# Patient Record
Sex: Female | Born: 1965 | Race: Black or African American | Hispanic: No | Marital: Married | Smoking: Never smoker
Health system: Southern US, Community
[De-identification: ages and names within clinical notes are randomized; demographics above are authoritative.]

## PROBLEM LIST (undated history)

## (undated) DIAGNOSIS — I10 Essential (primary) hypertension: Secondary | ICD-10-CM

## (undated) HISTORY — PX: CARPAL TUNNEL RELEASE: SHX101

## (undated) HISTORY — PX: BREAST ENHANCEMENT SURGERY: SHX7

---

## 1998-11-19 ENCOUNTER — Other Ambulatory Visit: Admission: RE | Admit: 1998-11-19 | Discharge: 1998-11-19 | Payer: Self-pay | Admitting: Obstetrics

## 1998-11-22 ENCOUNTER — Other Ambulatory Visit: Admission: RE | Admit: 1998-11-22 | Discharge: 1998-11-22 | Payer: Self-pay | Admitting: Obstetrics

## 2000-07-31 ENCOUNTER — Other Ambulatory Visit: Admission: RE | Admit: 2000-07-31 | Discharge: 2000-07-31 | Payer: Self-pay | Admitting: Obstetrics and Gynecology

## 2002-07-21 ENCOUNTER — Encounter: Payer: Self-pay | Admitting: Obstetrics and Gynecology

## 2002-07-21 ENCOUNTER — Encounter: Admission: RE | Admit: 2002-07-21 | Discharge: 2002-07-21 | Payer: Self-pay | Admitting: Obstetrics and Gynecology

## 2003-07-21 ENCOUNTER — Other Ambulatory Visit: Admission: RE | Admit: 2003-07-21 | Discharge: 2003-07-21 | Payer: Self-pay | Admitting: Obstetrics and Gynecology

## 2004-08-23 ENCOUNTER — Other Ambulatory Visit: Admission: RE | Admit: 2004-08-23 | Discharge: 2004-08-23 | Payer: Self-pay | Admitting: Obstetrics and Gynecology

## 2005-09-26 ENCOUNTER — Other Ambulatory Visit: Admission: RE | Admit: 2005-09-26 | Discharge: 2005-09-26 | Payer: Self-pay | Admitting: Obstetrics and Gynecology

## 2012-02-28 ENCOUNTER — Emergency Department
Admission: EM | Admit: 2012-02-28 | Discharge: 2012-02-28 | Disposition: A | Discharge status: Home Health | Payer: Self-pay | Source: Home / Self Care | Attending: Emergency Medicine | Admitting: Emergency Medicine

## 2012-02-28 ENCOUNTER — Encounter: Payer: Self-pay | Admitting: *Deleted

## 2012-02-28 DIAGNOSIS — M25562 Pain in left knee: Secondary | ICD-10-CM

## 2012-02-28 NOTE — ED Provider Notes (Signed)
History     CSN: [id number]  Arrival date & time 02/28/12  1332   First MD Initiated Contact with Patient 02/28/12 1359      Chief Complaint  Patient presents with  . Knee Pain    left    (Consider location/radiation/quality/duration/timing/severity/associated sxs/prior treatment) HPI  History reviewed. No pertinent past medical history.  Past Surgical History  Procedure Date  . Cesarean section   . Carpal tunnel release   . Breast enhancement surgery     Family History  Problem Relation Age of Onset  . Hypertension Mother     History  Substance Use Topics  . Smoking status: Never Smoker   . Smokeless tobacco: Not on file  . Alcohol Use: No    OB History    Grav Para Term Preterm Abortions TAB SAB Ect Mult Living                  Review of Systems  Allergies  Motrin  Home Medications   Current Outpatient Rx  Name Route Sig Dispense Refill  . NORGESTIMATE-ETH ESTRADIOL 0.25-35 MG-MCG PO TABS Oral Take 1 tablet by mouth daily.      BP 155/101  Pulse 72  Resp 14  Ht 5\' 3"  (1.6 m)  Wt 189 lb (85.73 kg)  BMI 33.48 kg/m2  SpO2 99%  Physical Exam  ED Course  Procedures (including critical care time)  Labs Reviewed - No data to display No results found.   1. Left knee pain       MDM   This patient was seen and evaluated but would not be charged. The patient with no insurance and we're deferring treatment to sports medicine since it didn't show requires a more thorough examination and treatment protocol. Basically this patient has had left knee pain for the last 4 months and is now complaining of left knee pain and stiffness. 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 In addition she has been participating in [place] which has been aggravating her knee. She is trying to lose weight as well as back in shape, and she feels that getting her knee better would make this easier. Her physical examination demonstrates  slightly decreased range of motion in terms of flexion compared to the contralateral side, no effusion, with some posterior tenderness. No other tenderness is elicited. McMurray's test does cause some irritation. Distal neurovascular status is intact. I think her pain is likely multifactorial, possibly from some arthritis, possibly from a small meniscal pathology, or muscle stiffness and weight gain. I wonder send her to sports medicine who can do more workup, x-rays or ultrasound and get her into physical therapy or treat her as necessary. However because I'm not doing anything for her today in our clinic in terms of treatment, I'm going to return her payment.    Marlaine Hind, MD 02/28/12 [phone]

## 2012-02-28 NOTE — ED Notes (Signed)
Patient c/o left knee pain. She has used ice and aleve.

## 2014-03-02 ENCOUNTER — Encounter: Payer: Self-pay | Admitting: Emergency Medicine

## 2014-03-02 ENCOUNTER — Emergency Department (INDEPENDENT_AMBULATORY_CARE_PROVIDER_SITE_OTHER)
Admission: EM | Admit: 2014-03-02 | Discharge: 2014-03-02 | Disposition: A | Discharge status: Home / Self Care | Payer: Self-pay | Source: Home / Self Care | Attending: Family Medicine | Admitting: Family Medicine

## 2014-03-02 DIAGNOSIS — N3000 Acute cystitis without hematuria: Secondary | ICD-10-CM

## 2014-03-02 DIAGNOSIS — R300 Dysuria: Secondary | ICD-10-CM

## 2014-03-02 LAB — POCT URINALYSIS DIP (MANUAL ENTRY)
Bilirubin, UA: NEGATIVE
GLUCOSE UA: NEGATIVE
Nitrite, UA: NEGATIVE
Protein Ur, POC: 100
Spec Grav, UA: 1.025 (ref 1.005–1.03)
Urobilinogen, UA: 0.2 (ref 0–1)
pH, UA: 6 (ref 5–8)

## 2014-03-02 MED ORDER — NITROFURANTOIN MONOHYD MACRO 100 MG PO CAPS: 100.0000 mg | ORAL_CAPSULE | Freq: Two times a day (BID) | ORAL | Status: DC

## 2014-03-02 NOTE — ED Provider Notes (Signed)
CSN: [id number]     Arrival date & time 03/02/14  1126 History   First MD Initiated Contact with Patient 03/02/14 1140     Chief Complaint  Patient presents with  . Dysuria  . Abdominal Pain      HPI Comments: Patient complains of onset of lower abdominal pressure 2 days ago, and discomfort with urination.  No fevers, chills, and sweats.  She states that she is just finishing a normal menstrual period.  Patient is a 48 y.o. female presenting with dysuria. The history is provided by the patient.  Dysuria Pain quality:  Burning Pain severity:  Mild Onset quality:  Sudden Duration:  2 days Timing:  Constant Progression:  Unchanged Chronicity:  New Recent urinary tract infections: no   Relieved by:  Nothing Worsened by:  Nothing tried Ineffective treatments:  None tried Urinary symptoms: frequent urination   Urinary symptoms: no discolored urine, no foul-smelling urine, no hematuria, no hesitancy and no bladder incontinence   Associated symptoms: abdominal pain   Associated symptoms: no fever, no flank pain, no genital lesions, no nausea, no vaginal discharge and no vomiting   Risk factors: no recurrent urinary tract infections     History reviewed. No pertinent past medical history. Past Surgical History  Procedure Laterality Date  . Cesarean section    . Carpal tunnel release    . Breast enhancement surgery     Family History  Problem Relation Age of Onset  . Hypertension Mother    History  Substance Use Topics  . Smoking status: Never Smoker   . Smokeless tobacco: Not on file  . Alcohol Use: No   OB History   Grav Para Term Preterm Abortions TAB SAB Ect Mult Living                 Review of Systems  Constitutional: Negative for fever.  Gastrointestinal: Positive for abdominal pain. Negative for nausea and vomiting.  Genitourinary: Positive for dysuria. Negative for flank pain and vaginal discharge.  All other systems reviewed and are negative.    Allergies   Motrin  Home Medications   Current Outpatient Rx  Name  Route  Sig  Dispense  Refill  . nitrofurantoin, macrocrystal-monohydrate, (MACROBID) 100 MG capsule   Oral   Take 1 capsule (100 mg total) by mouth 2 (two) times daily. Take with food.   14 capsule   0   . norgestimate-ethinyl estradiol (ORTHO-CYCLEN,SPRINTEC,PREVIFEM) 0.25-35 MG-MCG tablet   Oral   Take 1 tablet by mouth daily.          BP 136/83  Pulse 96  Temp(Src) 97.9 F (36.6 C) (Oral)  Resp 16  Ht 5\' 3"  (1.6 m)  Wt 194 lb 8 oz (88.225 kg)  BMI 34.46 kg/m2  SpO2 99% Physical Exam Nursing notes and Vital Signs reviewed. Appearance:  Patient appears stated age, and in no acute distress.  Patient is obese (BMI 34.5) Eyes:  Pupils are equal, round, and reactive to light and accomodation.  Extraocular movement is intact.  Conjunctivae are not inflamed  Pharynx:  Normal Neck:  Supple.  No adenopathy Lungs:  Clear to auscultation.  Breath sounds are equal.  Heart:  Regular rate and rhythm without murmurs, rubs, or gallops.  Abdomen:  Mild tenderness midline over bladder without masses or hepatosplenomegaly.  Bowel sounds are present.  No CVA or flank tenderness.  Extremities:  No edema.  No calf tenderness Skin:  No rash present.   ED Course  Procedures  Labs Reviewed  URINE CULTURE  POCT URINALYSIS DIP (MANUAL ENTRY); KET 15mg /dL; BLO moderate; PRO 100mg /dL; LEU small        MDM   1. Dysuria   2. Acute cystitis    Urine culture pending.  Begin Macrobid. Increase fluid intake. Followup with Family Doctor if not improved in one week.     Kandra Nicolas, MD 03/02/14 [phone]

## 2014-03-02 NOTE — ED Notes (Signed)
C/o lower mid abdominal pain and pressure with urination since Saturday. Has not tried anything otc.

## 2014-03-02 NOTE — Discharge Instructions (Signed)
Increase fluid intake. ° ° °Urinary Tract Infection °Urinary tract infections (UTIs) can develop anywhere along your urinary tract. Your urinary tract is your body's drainage system for removing wastes and extra water. Your urinary tract includes two kidneys, two ureters, a bladder, and a urethra. Your kidneys are a pair of bean-shaped organs. Each kidney is about the size of your fist. They are located below your ribs, one on each side of your spine. °CAUSES °Infections are caused by microbes, which are microscopic organisms, including fungi, viruses, and bacteria. These organisms are so small that they can only be seen through a microscope. Bacteria are the microbes that most commonly cause UTIs. °SYMPTOMS  °Symptoms of UTIs may vary by age and gender of the patient and by the location of the infection. Symptoms in young women typically include a frequent and intense urge to urinate and a painful, burning feeling in the bladder or urethra during urination. Older women and men are more likely to be tired, shaky, and weak and have muscle aches and abdominal pain. A fever may mean the infection is in your kidneys. Other symptoms of a kidney infection include pain in your back or sides below the ribs, nausea, and vomiting. °DIAGNOSIS °To diagnose a UTI, your caregiver will ask you about your symptoms. Your caregiver also will ask to provide a urine sample. The urine sample will be tested for bacteria and white blood cells. White blood cells are made by your body to help fight infection. °TREATMENT  °Typically, UTIs can be treated with medication. Because most UTIs are caused by a bacterial infection, they usually can be treated with the use of antibiotics. The choice of antibiotic and length of treatment depend on your symptoms and the type of bacteria causing your infection. °HOME CARE INSTRUCTIONS °· If you were prescribed antibiotics, take them exactly as your caregiver instructs you. Finish the medication even if  you feel better after you have only taken some of the medication. °· Drink enough water and fluids to keep your urine clear or pale yellow. °· Avoid caffeine, tea, and carbonated beverages. They tend to irritate your bladder. °· Empty your bladder often. Avoid holding urine for long periods of time. °· Empty your bladder before and after sexual intercourse. °· After a bowel movement, women should cleanse from front to back. Use each tissue only once. °SEEK MEDICAL CARE IF:  °· You have back pain. °· You develop a fever. °· Your symptoms do not begin to resolve within 3 days. °SEEK IMMEDIATE MEDICAL CARE IF:  °· You have severe back pain or lower abdominal pain. °· You develop chills. °· You have nausea or vomiting. °· You have continued burning or discomfort with urination. °MAKE SURE YOU:  °· Understand these instructions. °· Will watch your condition. °· Will get help right away if you are not doing well or get worse. °Document Released: 09/13/2005 Document Revised: 06/04/2012 Document Reviewed: 01/12/2012 °[employer]® Patient Information ©2014 [employer]. ° °

## 2014-03-03 LAB — URINE CULTURE
COLONY COUNT: NO GROWTH
Organism ID, Bacteria: NO GROWTH

## 2014-03-05 ENCOUNTER — Telehealth: Payer: Self-pay | Admitting: *Deleted

## 2014-03-06 ENCOUNTER — Ambulatory Visit (HOSPITAL_BASED_OUTPATIENT_CLINIC_OR_DEPARTMENT_OTHER): Admission: RE | Admit: 2014-03-06 | Payer: Self-pay | Source: Ambulatory Visit

## 2014-03-06 ENCOUNTER — Ambulatory Visit (HOSPITAL_BASED_OUTPATIENT_CLINIC_OR_DEPARTMENT_OTHER)
Admission: RE | Admit: 2014-03-06 | Discharge: 2014-03-06 | Disposition: A | Discharge status: Home / Self Care | Payer: Self-pay | Source: Ambulatory Visit | Attending: Physician Assistant | Admitting: Physician Assistant

## 2014-03-06 ENCOUNTER — Other Ambulatory Visit (HOSPITAL_BASED_OUTPATIENT_CLINIC_OR_DEPARTMENT_OTHER): Payer: Self-pay

## 2014-03-06 ENCOUNTER — Ambulatory Visit (INDEPENDENT_AMBULATORY_CARE_PROVIDER_SITE_OTHER): Payer: Self-pay | Admitting: Physician Assistant

## 2014-03-06 ENCOUNTER — Encounter: Payer: Self-pay | Admitting: Physician Assistant

## 2014-03-06 VITALS — BP 139/84 | HR 95 | Ht 63.0 in | Wt 197.0 lb

## 2014-03-06 DIAGNOSIS — R109 Unspecified abdominal pain: Secondary | ICD-10-CM

## 2014-03-06 DIAGNOSIS — R1032 Left lower quadrant pain: Secondary | ICD-10-CM | POA: Insufficient documentation

## 2014-03-06 DIAGNOSIS — D259 Leiomyoma of uterus, unspecified: Secondary | ICD-10-CM | POA: Insufficient documentation

## 2014-03-06 DIAGNOSIS — N949 Unspecified condition associated with female genital organs and menstrual cycle: Secondary | ICD-10-CM | POA: Insufficient documentation

## 2014-03-06 DIAGNOSIS — Z79899 Other long term (current) drug therapy: Secondary | ICD-10-CM | POA: Insufficient documentation

## 2014-03-06 LAB — POCT URINE PREGNANCY: Preg Test, Ur: NEGATIVE

## 2014-03-06 IMAGING — US US PELVIS COMPLETE
1 series · 13 of 25 positions shown · non-contrast
Comparison: None

CLINICAL DATA: Bilateral pelvic pain since [DATE], left lower
quadrant pain and pressure, on oral contraceptives for fibroids

EXAM:
TRANSABDOMINAL AND TRANSVAGINAL ULTRASOUND OF PELVIS
TECHNIQUE: Both transabdominal and transvaginal ultrasound examinations of the
pelvis were performed. Transabdominal technique was performed for
global imaging of the pelvis including uterus, ovaries, adnexal
regions, and pelvic cul-de-sac. It was necessary to proceed with
endovaginal exam following the transabdominal exam to visualize the
endometrium and ovaries.

[Series 1: us pelvis complete · 0.33mm/px · 13 of 110 slices shown]
[im 1/110]
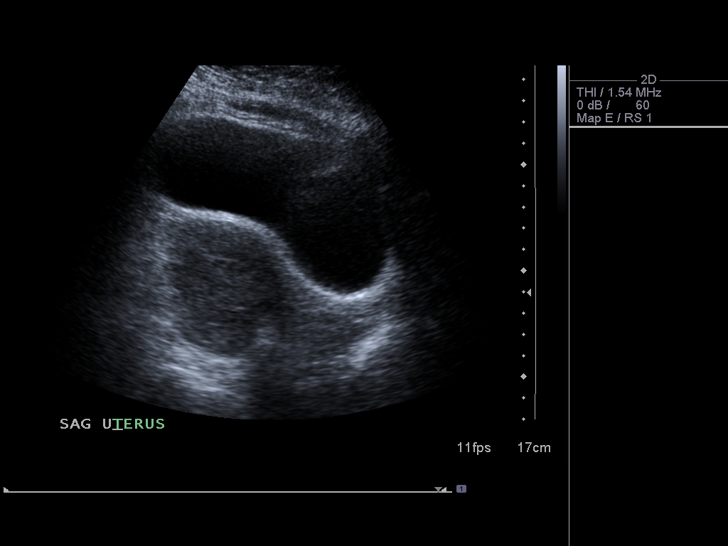
[im 10/110]
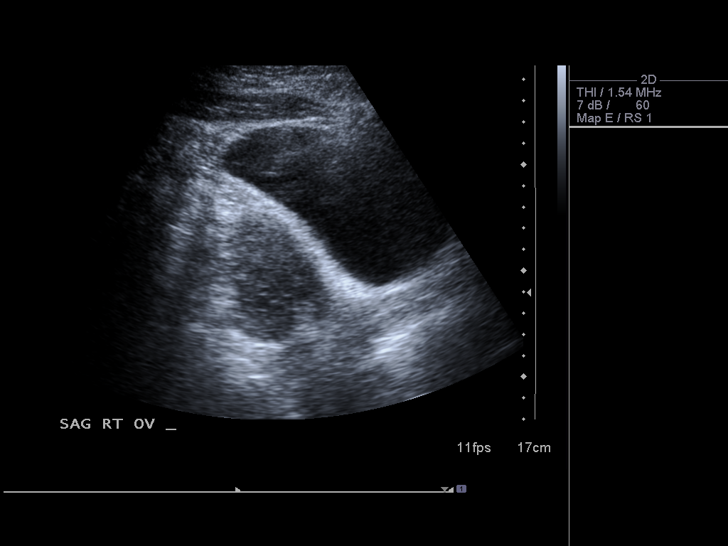
[im 19/110]
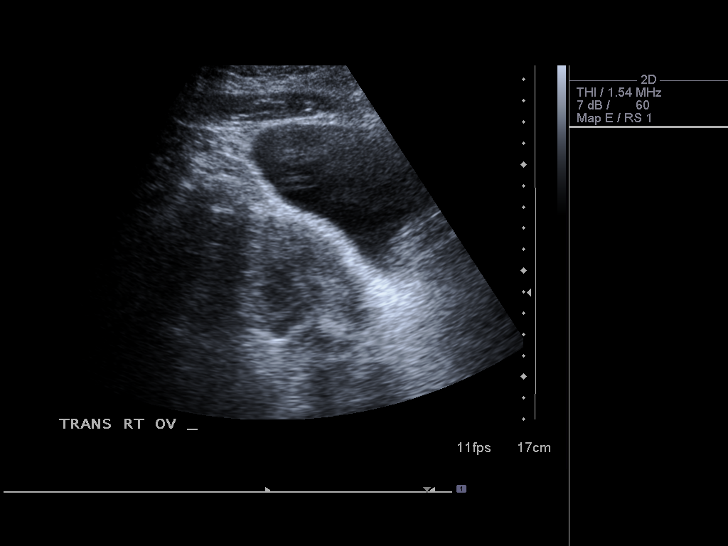
[im 28/110]
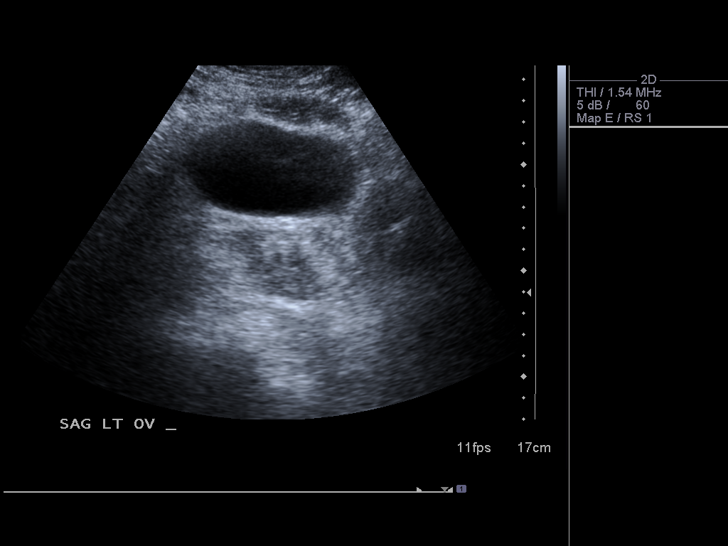
[im 37/110]
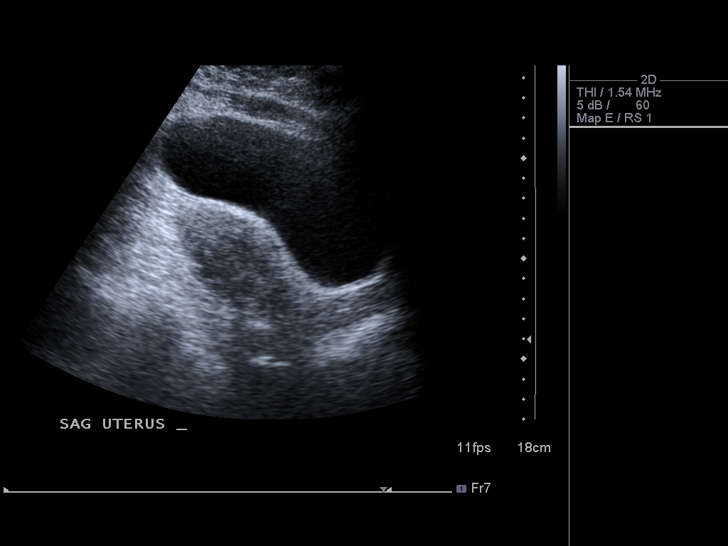
[im 46/110]
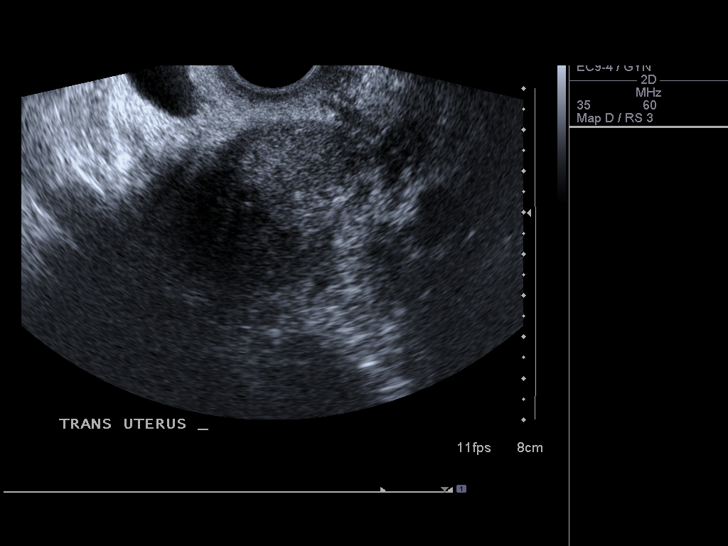
[im 55/110]
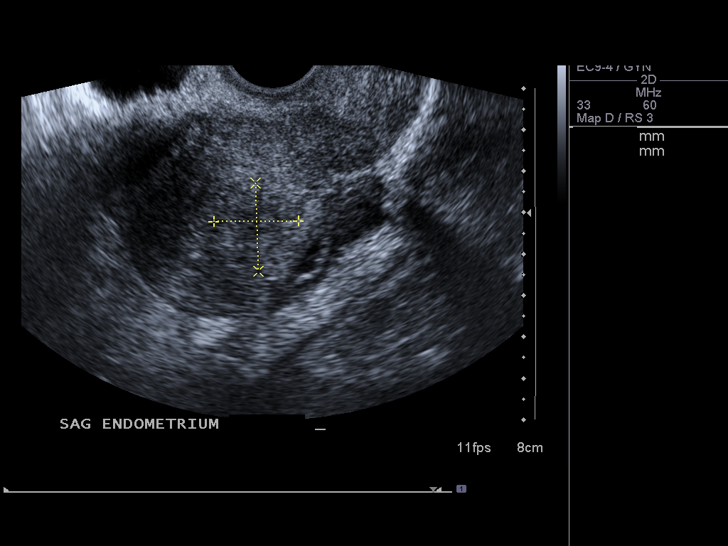
[im 64/110]
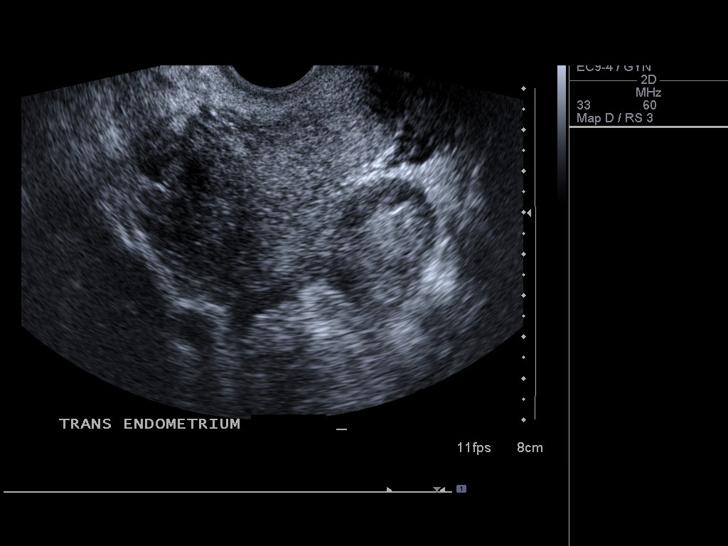
[im 73/110]
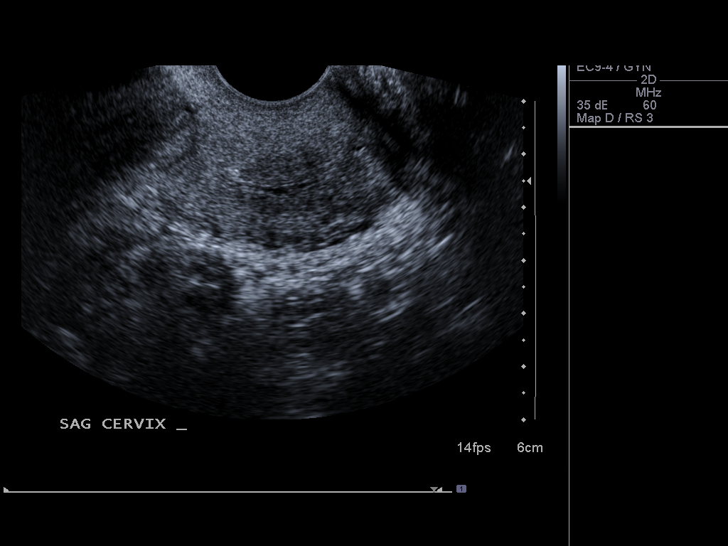
[im 82/110]
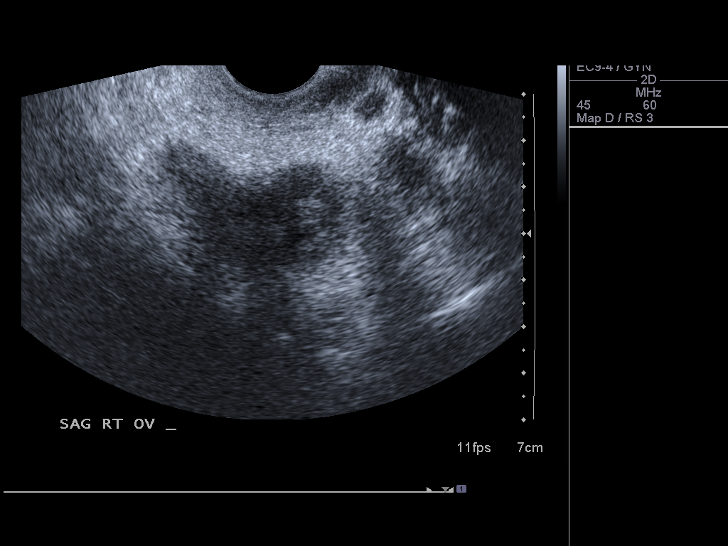
[im 91/110]
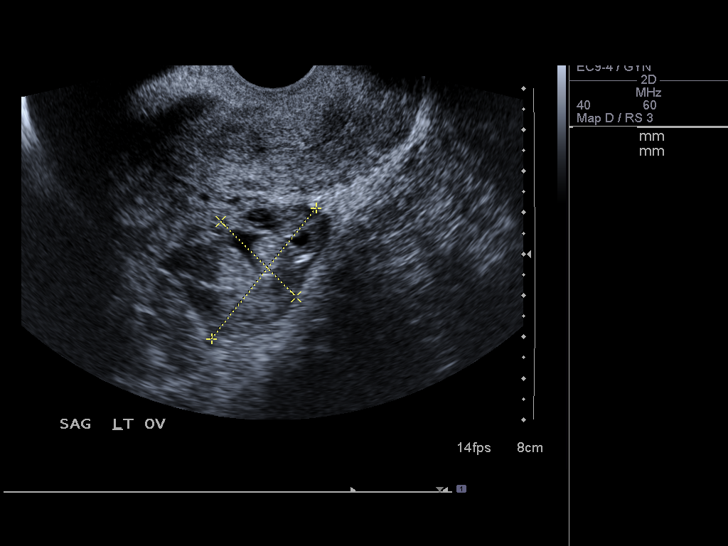
[im 100/110]
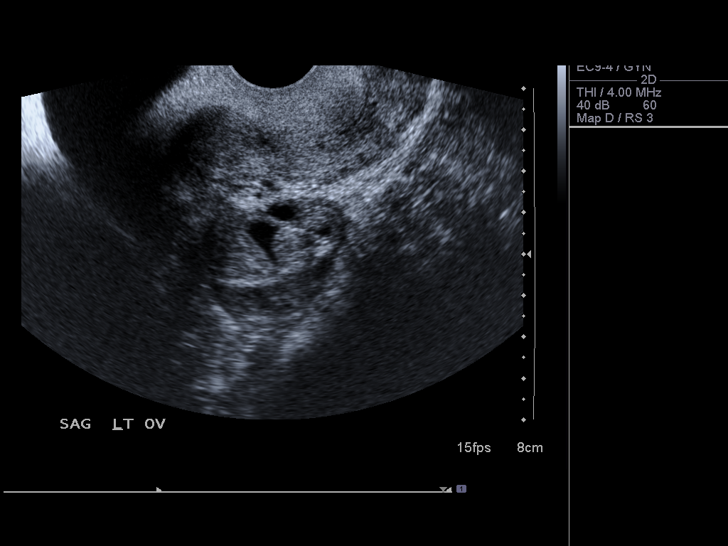
[im 110/110]
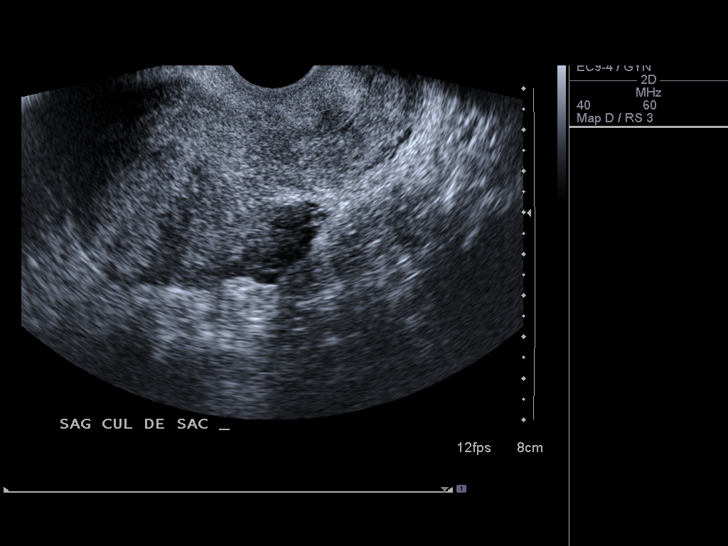

[13 of 25 positions shown; findings below may reference images not displayed]

FINDINGS: Uterus

Measurements: 9.6 x 4.3 x 5.7 cm. Diffusely in homogeneous
myometrial echogenicity. Questionable vague leiomyomata up this 2 cm
diameter head right upper uterine segment.

Endometrium

Thickness: 8 mm thick, normal.  No endometrial fluid

Right ovary

Measurements: 3.3 x 2.9 x 2.6 cm. Normal morphology without mass.
Internal blood flow present on color Doppler imaging.

Left ovary

Measurements: 4.0 x 2.6 x 3.2 cm. Internal blood flow present on
color Doppler imaging. Area of heterogeneous echogenicity with small
follicles and slightly increased echogenicity is seen, approximately
3.6 cm in greatest dimension.

Other findings

No free pelvic fluid or other adnexal masses.
IMPRESSION: Question small uterine leiomyomata up to 2 cm diameter.

Unremarkable endometrial complex and right ovary.

Heterogeneous appearing left ovary, favor heterogeneous parenchymal
echogenicity rather than a discrete ovarian mass.

## 2014-03-06 MED ORDER — TRAMADOL HCL 50 MG PO TABS: ORAL_TABLET | ORAL | Status: DC

## 2014-03-06 NOTE — Patient Instructions (Signed)
2 aleve twice a day.  Tramadol as needed for pain.  Will call with ultrasound.

## 2014-03-06 NOTE — Progress Notes (Signed)
   Subjective:    Patient ID: Wendy Frye, female    DOB: 03-06-1966, 48 y.o.   MRN: [id number]  HPI Pt is a 48 yo female who presents to the clinic to establish care and discuss lower abdominal pressure and pain. The symptoms have been going on for 7 days as of today. Pain can get as high as 9.5 out of 10 pain. After urination and bowel movements  pain/pressure  is the worse. Aleve can sometimes ease the pain but never gets rid of the pain and pressure. Patient denies any nausea, vomiting, fever, chills, back pain. She denies any abnormal vaginal bleeding. She's never had anything like this before. She denies any family history of ovarian or uterine abnormalities. She went to urgent care on 03/02/2014 and was treated for a urinary tract infection. She started the Macrobid but received no relief from pain and was called and told urine culture was negative for any bacteria. She then stopped the antibiotic. She continues to try peppermint tea and Gas-X. She denies any bowel movement changes, diarrhea, constipation. Patient denies any stool color changes. She does notice the pain worsens significantly with bowel movements and urination.   Review of Systems     Objective:   Physical Exam  Constitutional: She is oriented to person, place, and time. She appears well-developed and well-nourished.  HENT:  Head: Normocephalic and atraumatic.  Neck: Neck supple.  Cardiovascular: Normal rate, regular rhythm and normal heart sounds.   Pulmonary/Chest: Effort normal and breath sounds normal.  Abdominal: She exhibits distension. She exhibits no mass. There is tenderness. There is guarding.  Very bloated and distended abdomen. Specific intense pain with palpation over the left lower quadrant. Negative Murphy's sign. Abdomen was very tight to palpation.  Neurological: She is alert and oriented to person, place, and time.  Skin: Skin is dry.  Psychiatric: She has a normal mood and affect. Her behavior is  normal.          Assessment & Plan:  Abdominal pressure/Left lower quadrant abdominal pain--UPT was negative today. UA was positive for large leukocytes and moderate blood. We'll order might as well as another urine culture. Kidney stones are on the differential however pain with palpation over lower left quadrant with significant and did not To me to believe this is kidney stones. Did send patient down to get stat pelvic and transvaginal ultrasound. With the symptom of pressure concerned about a mass. In the meantime patient encouraged to continue to take naproxen twice a day as well as tramadol given for breakthrough pain. We'll call with stat results. Will consider CT scan of CBC elevated. Diverticulitis recently on differential.

## 2014-03-07 LAB — URINALYSIS, MICROSCOPIC ONLY
BACTERIA UA: NONE SEEN
Casts: NONE SEEN
Crystals: NONE SEEN

## 2014-03-08 LAB — URINE CULTURE
COLONY COUNT: NO GROWTH
Organism ID, Bacteria: NO GROWTH

## 2014-03-09 ENCOUNTER — Other Ambulatory Visit: Payer: Self-pay | Admitting: Physician Assistant

## 2014-03-09 ENCOUNTER — Encounter: Payer: Self-pay | Admitting: Physician Assistant

## 2014-03-09 DIAGNOSIS — D259 Leiomyoma of uterus, unspecified: Secondary | ICD-10-CM | POA: Insufficient documentation

## 2014-03-09 DIAGNOSIS — R9389 Abnormal findings on diagnostic imaging of other specified body structures: Secondary | ICD-10-CM

## 2014-03-09 MED ORDER — METRONIDAZOLE 500 MG PO TABS: 500.0000 mg | ORAL_TABLET | Freq: Two times a day (BID) | ORAL | Status: DC

## 2014-03-09 MED ORDER — CIPROFLOXACIN HCL 500 MG PO TABS: 500.0000 mg | ORAL_TABLET | Freq: Two times a day (BID) | ORAL | Status: DC

## 2014-03-09 NOTE — Progress Notes (Signed)
Called pt made aware of u/s results. Still having pain. Tramadol helps. Did not get cbc drawn. No insurance. I am going to treat for diverticulitis as I talk with OB/gYN to see if this could be causing pain. If not improving will make referral.

## 2015-02-03 ENCOUNTER — Emergency Department
Admission: EM | Admit: 2015-02-03 | Discharge: 2015-02-03 | Disposition: A | Discharge status: Home / Self Care | Payer: Self-pay | Source: Home / Self Care | Attending: Family Medicine | Admitting: Family Medicine

## 2015-02-03 ENCOUNTER — Encounter: Payer: Self-pay | Admitting: Emergency Medicine

## 2015-02-03 ENCOUNTER — Emergency Department (INDEPENDENT_AMBULATORY_CARE_PROVIDER_SITE_OTHER): Payer: Self-pay

## 2015-02-03 DIAGNOSIS — R1011 Right upper quadrant pain: Secondary | ICD-10-CM

## 2015-02-03 DIAGNOSIS — R1030 Lower abdominal pain, unspecified: Secondary | ICD-10-CM

## 2015-02-03 DIAGNOSIS — K5732 Diverticulitis of large intestine without perforation or abscess without bleeding: Secondary | ICD-10-CM

## 2015-02-03 LAB — POCT URINALYSIS DIP (MANUAL ENTRY)
Bilirubin, UA: NEGATIVE
Glucose, UA: NEGATIVE
Ketones, POC UA: NEGATIVE
Leukocytes, UA: NEGATIVE
Nitrite, UA: NEGATIVE
Protein Ur, POC: 30
Spec Grav, UA: 1.025 (ref 1.005–1.03)
Urobilinogen, UA: 0.2 (ref 0–1)
pH, UA: 5.5 (ref 5–8)

## 2015-02-03 LAB — POCT CBC W AUTO DIFF (K'VILLE URGENT CARE)

## 2015-02-03 IMAGING — CT CT ABD-PELV W/ CM
2 of 5 series · 13 of 32 positions shown, 18 images · IV contrast (omnipaque)
Comparison: None.

CLINICAL DATA: Right upper quadrant pain for 10 days L1-S1 count

EXAM:
CT ABDOMEN AND PELVIS WITH CONTRAST
TECHNIQUE: Multidetector CT imaging of the abdomen and pelvis was performed
using the standard protocol following bolus administration of
intravenous contrast.
CONTRAST:  100 mL Omnipaque 300.

[Series 2: abd/pelvis with · axial · 0.81mm/px · z∈[-325,-10]mm · 5 of 95 slices shown, 10 images]
[im 16/95  soft-tissue]
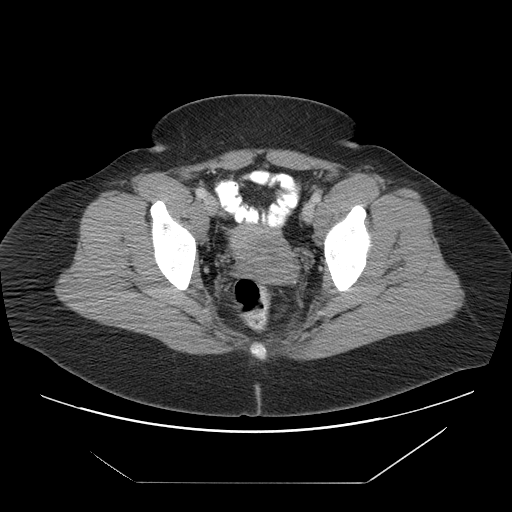
[im 16/95  bone]
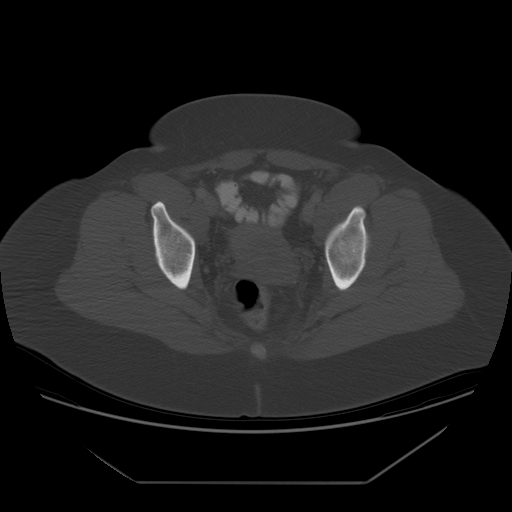
[im 32/95  soft-tissue]
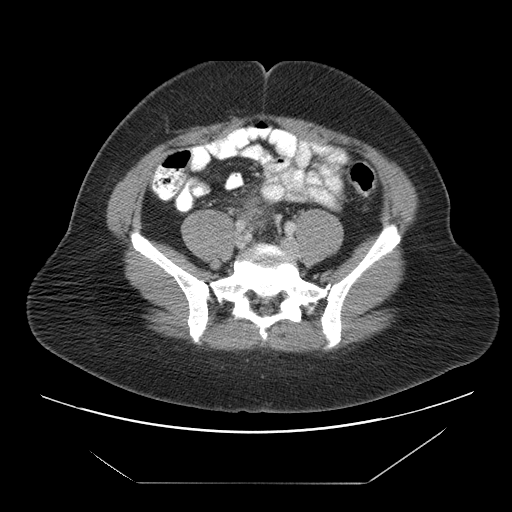
[im 32/95  lung]
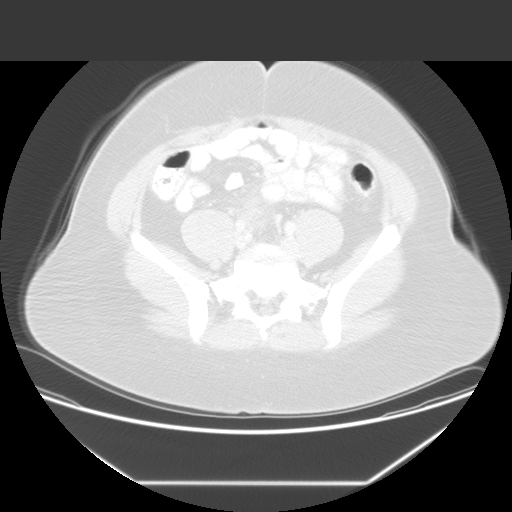
[im 48/95  soft-tissue]
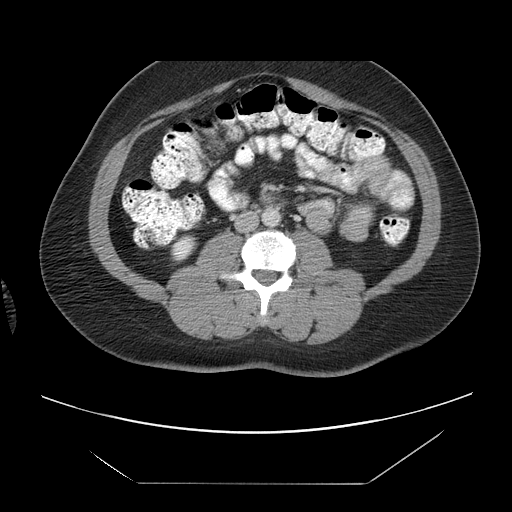
[im 48/95  lung]
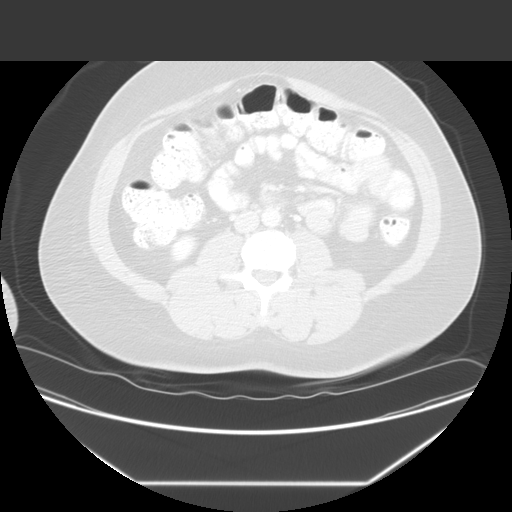
[im 63/95  soft-tissue]
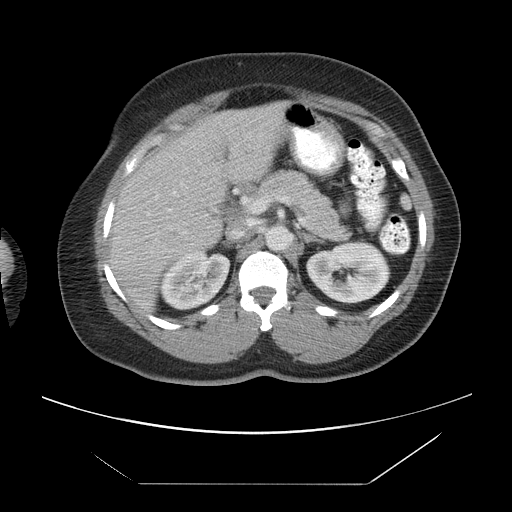
[im 63/95  lung]
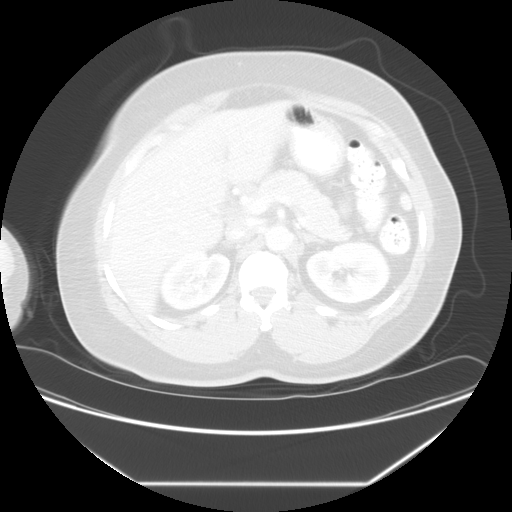
[im 79/95  soft-tissue]
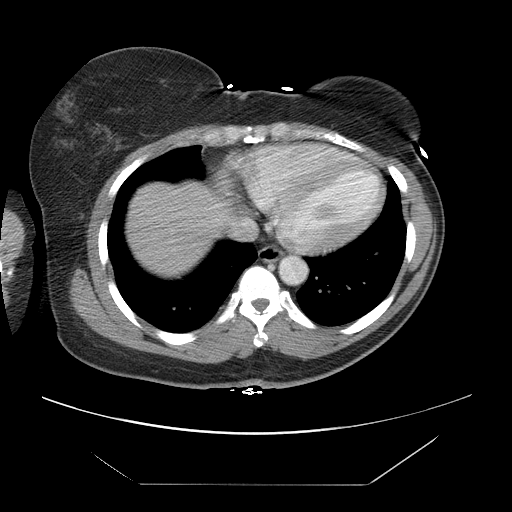
[im 79/95  lung]
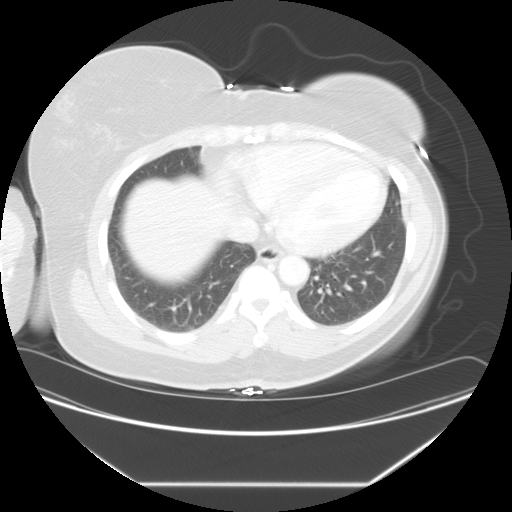

[Series 400: sag · sagittal · 0.90mm/px · 8 of 153 slices shown]
[im 16/153  soft-tissue]
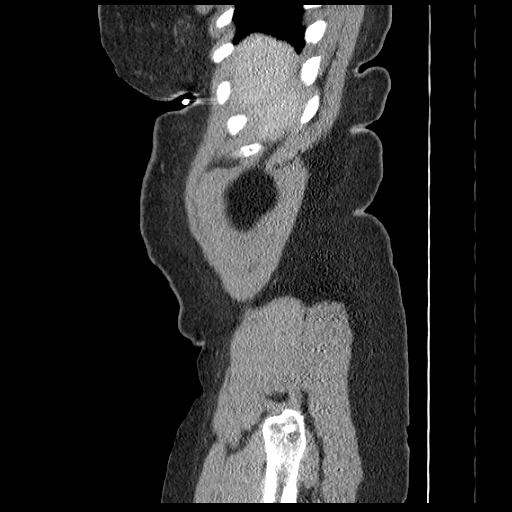
[im 31/153  soft-tissue]
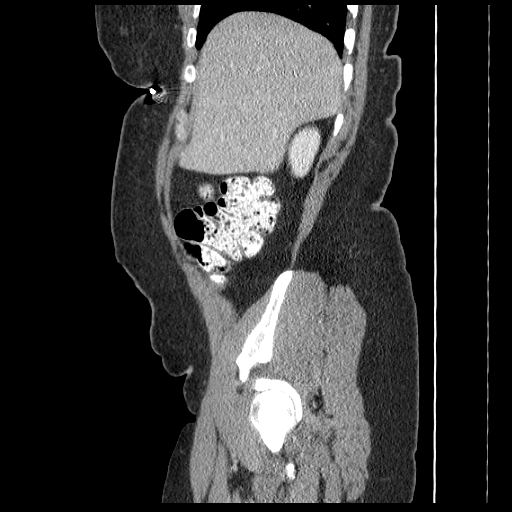
[im 46/153  soft-tissue]
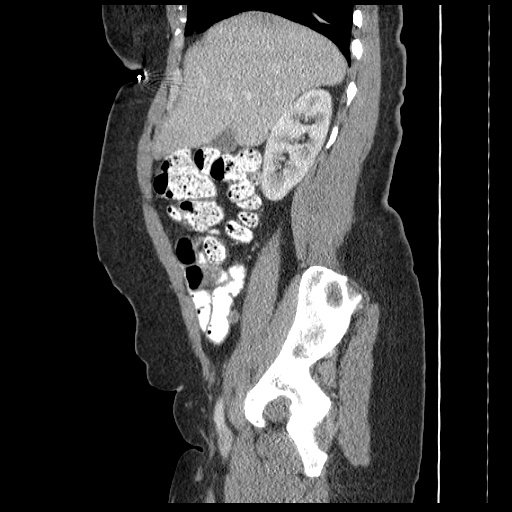
[im 61/153  soft-tissue]
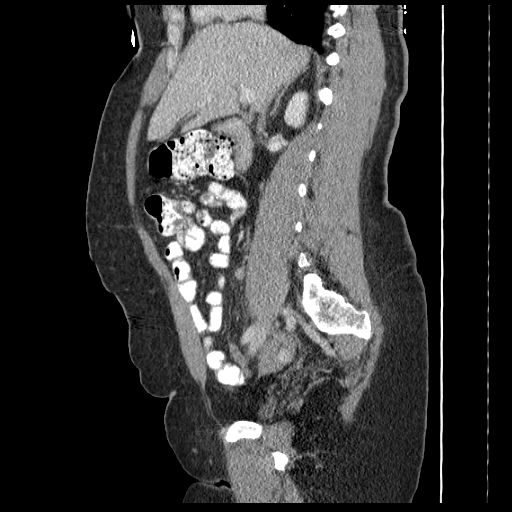
[im 92/153  soft-tissue]
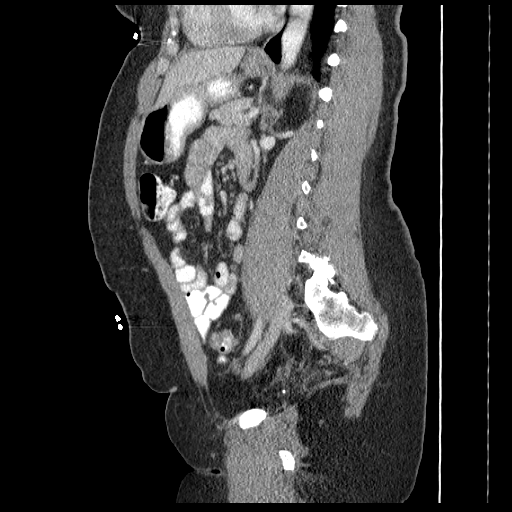
[im 107/153  soft-tissue]
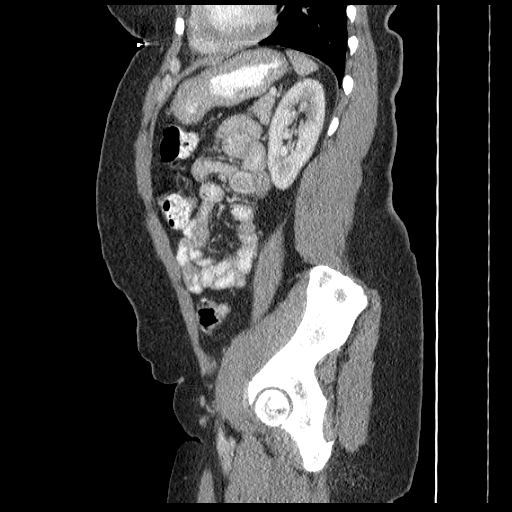
[im 122/153  soft-tissue]
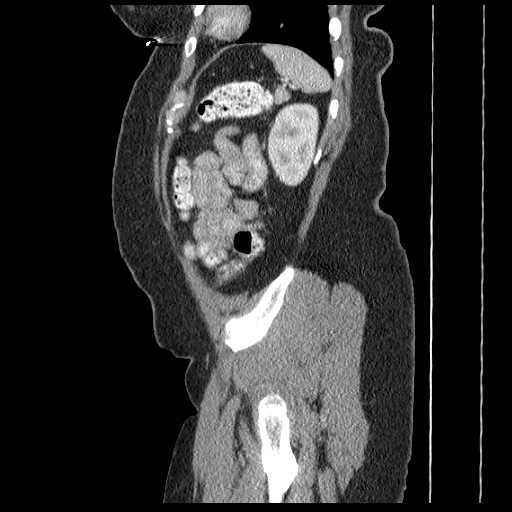
[im 137/153  soft-tissue]
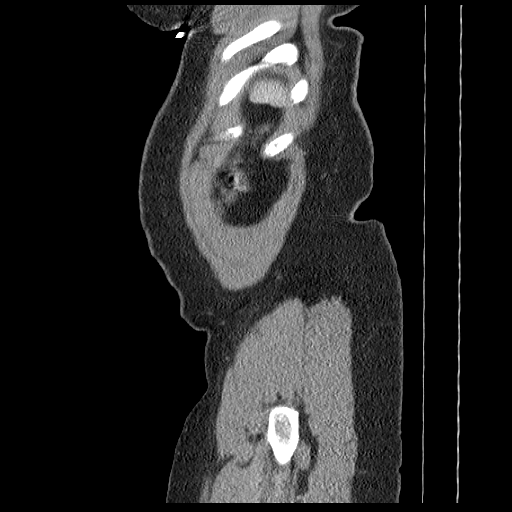

[13 of 32 positions shown; findings below may reference images not displayed]

FINDINGS: The lung bases are free of acute infiltrate or sizable effusion.

The liver, gallbladder, spleen, adrenal glands and pancreas are all
normal in their CT appearance. The kidneys are well visualized
bilaterally. No renal calculi or urinary tract obstructive changes
are seen. Delayed images through the kidneys show no focal
abnormality.

The appendix is within normal limits. No inflammatory changes are
seen. Diverticular changes noted within the colon. Some very mild
pericolonic inflammatory changes are seen at the rectosigmoid
junction. No perforation or abscess is identified. No free pelvic
fluid is seen. The bladder is well distended. No pelvic mass lesion
is noted. No acute bony abnormality is seen.
IMPRESSION: Changes consistent with early diverticulitis at the rectosigmoid
junction. No perforation or abscess is identified.

## 2015-02-03 MED ORDER — IOHEXOL 300 MG/ML  SOLN
100.0000 mL | Freq: Once | INTRAMUSCULAR | Status: AC | PRN
Start: 2015-02-03 — End: 2015-02-03
  Administered 2015-02-03: 100 mL via INTRAVENOUS

## 2015-02-03 MED ORDER — METRONIDAZOLE 500 MG PO TABS: ORAL_TABLET | ORAL | Status: DC

## 2015-02-03 MED ORDER — CIPROFLOXACIN HCL 500 MG PO TABS: 500.0000 mg | ORAL_TABLET | Freq: Two times a day (BID) | ORAL | Status: DC

## 2015-02-03 NOTE — ED Provider Notes (Signed)
CSN: [id number]     Arrival date & time 02/03/15  6568 History   None    Chief Complaint  Patient presents with  . Abdominal Pain      HPI Comments: Patient awoke about 10 days ago with increased bowel gas, bloating, and mild abdominal discomfort that has persisted.  No nausea/vomiting and bowel movements have been normal.  No fevers, chills, and sweats.  She has had a similar episode in the past that resolved with antibiotics.  She reports that recently she has been trying to eat more healthily.  She tried a probiotic which made her symptoms worse.  She has had mild improvement with "GasX."  She has a past history of lactose intolerance.  Patient is a 49 y.o. female presenting with abdominal pain. The history is provided by the patient.  Abdominal Pain Pain location:  Periumbilical Pain quality: bloating, dull and fullness   Pain quality: not burning, not cramping and not shooting   Pain radiates to:  Does not radiate Pain severity:  Mild Onset quality:  Gradual Duration:  10 days Timing:  Intermittent Progression:  Unchanged Chronicity:  Recurrent Context: diet changes and eating   Context: not awakening from sleep, not recent illness, not recent travel and not sick contacts   Relieved by: Elberta Leatherwood. Worsened by:  Eating Ineffective treatments: probiotic. Associated symptoms: belching and flatus   Associated symptoms: no anorexia, no chest pain, no chills, no constipation, no cough, no diarrhea, no dysuria, no fatigue, no fever, no hematemesis, no hematochezia, no hematuria, no melena, no nausea and no vomiting     History reviewed. No pertinent past medical history. Past Surgical History  Procedure Laterality Date  . Cesarean section    . Carpal tunnel release    . Breast enhancement surgery     Family History  Problem Relation Age of Onset  . Hypertension Mother   . Diabetes Mother   . Hyperlipidemia Mother   . Alcohol abuse Father   . Diabetes Maternal Grandmother   .  Hypertension Maternal Grandmother    History  Substance Use Topics  . Smoking status: Never Smoker   . Smokeless tobacco: Not on file  . Alcohol Use: No   OB History    No data available     Review of Systems  Constitutional: Negative for fever, chills and fatigue.  Respiratory: Negative for cough.   Cardiovascular: Negative for chest pain.  Gastrointestinal: Positive for abdominal pain and flatus. Negative for nausea, vomiting, diarrhea, constipation, melena, hematochezia, anorexia and hematemesis.  Genitourinary: Negative for dysuria and hematuria.  All other systems reviewed and are negative.   Allergies  Motrin  Home Medications   Prior to Admission medications   Medication Sig Start Date End Date Taking? Authorizing Provider  potassium chloride (K-DUR,KLOR-CON) 10 MEQ tablet Take 10 mEq by mouth 2 (two) times daily.   Yes Historical Provider, MD  ciprofloxacin (CIPRO) 500 MG tablet Take 1 tablet (500 mg total) by mouth 2 (two) times daily. For 7 days. 02/03/15   Kandra Nicolas, MD  metroNIDAZOLE (FLAGYL) 500 MG tablet Take one tab by mouth every 6 hours for 7 days. 02/03/15   Kandra Nicolas, MD  norgestimate-ethinyl estradiol (ORTHO-CYCLEN,SPRINTEC,PREVIFEM) 0.25-35 MG-MCG tablet Take 1 tablet by mouth daily.    Historical Provider, MD  traMADol (ULTRAM) 50 MG tablet Take one tablet up to every 8 hours for pain. 03/06/14   Jade L Breeback, PA-C   BP 144/87 mmHg  Pulse 97  Temp(Src) 98.5 F (36.9 C) (Oral)  Ht 5\' 3"  (1.6 m)  Wt 198 lb (89.812 kg)  BMI 35.08 kg/m2  SpO2 98%  LMP 11/03/2014 Physical Exam Nursing notes and Vital Signs reviewed. Appearance:  Patient appears stated age, and in no acute distress.  Patient is obese (BMI 35.1) Eyes:  Pupils are equal, round, and reactive to light and accomodation.  Extraocular movement is intact.  Conjunctivae are not inflamed  Ears:  Canals normal.  Tympanic membranes normal.  Nose:   Normal turbinates.  No sinus  tenderness.    Pharynx:  Normal; moist mucous membranes  Neck:  Supple.  No adenopathy Lungs:  Clear to auscultation.  Breath sounds are equal.  Heart:  Regular rate and rhythm without murmurs, rubs, or gallops.  Abdomen:   There is tenderness generally over the colon without masses or hepatosplenomegaly.  No rebound tenderness.  Bowel sounds are present.  No CVA or flank tenderness.  Extremities:  No edema.  No calf tenderness Skin:  No rash present.   ED Course  Procedures  None    Labs Reviewed  POCT URINALYSIS DIP (MANUAL ENTRY) BLO small; PRO 30mg /dL; otherwise negative  POCT CBC W AUTO DIFF ([facility]):  WBC 11.6; LY 21.4; MO 6.4; GR 72.2; Hgb 13.0; Platelets 421     Imaging Review Ct Abdomen Pelvis W Contrast  02/03/2015   CLINICAL DATA:  Right upper quadrant pain for 10 days L1-S1 count  EXAM: CT ABDOMEN AND PELVIS WITH CONTRAST  TECHNIQUE: Multidetector CT imaging of the abdomen and pelvis was performed using the standard protocol following bolus administration of intravenous contrast.  CONTRAST:  100 mL Omnipaque 300.  COMPARISON:  None.  FINDINGS: The lung bases are free of acute infiltrate or sizable effusion.  The liver, gallbladder, spleen, adrenal glands and pancreas are all normal in their CT appearance. The kidneys are well visualized bilaterally. No renal calculi or urinary tract obstructive changes are seen. Delayed images through the kidneys show no focal abnormality.  The appendix is within normal limits. No inflammatory changes are seen. Diverticular changes noted within the colon. Some very mild pericolonic inflammatory changes are seen at the rectosigmoid junction. No perforation or abscess is identified. No free pelvic fluid is seen. The bladder is well distended. No pelvic mass lesion is noted. No acute bony abnormality is seen.  IMPRESSION: Changes consistent with early diverticulitis at the rectosigmoid junction. No perforation or abscess is identified.    Electronically Signed   By: Inez Catalina M.D.   On: 02/03/2015 12:08     MDM   1. Diverticulitis of colon   2. Lower abdominal pain    Begin Cipro 500mg  BID for 7 days, and Flagyl 500mg  Q6hr for 7 days. Begin clear liquids for about 18 to 24 hours, then may begin a BRAT diet (Bananas, Rice, Applesauce, Toast) when nausea and dizziness resolved.  Then gradually advance to a regular diet as tolerated.  Avoid milk products until well.  May take Tylenol for pain. Followup with PCP in one week. Recommend referral to GI for followup colonoscopy.   If symptoms become significantly worse during the night or over the weekend, proceed to the local emergency room.     Kandra Nicolas, MD 02/08/15 [phone]

## 2015-02-03 NOTE — Discharge Instructions (Signed)
Begin clear liquids for about 18 to 24 hours, then may begin a BRAT diet (Bananas, Rice, Applesauce, Toast) when nausea and dizziness resolved.  Then gradually advance to a regular diet as tolerated.  Avoid milk products until well.  May take Tylenol for pain.   If symptoms become significantly worse during the night or over the weekend, proceed to the local emergency room.    Diverticulitis Diverticulitis is inflammation or infection of small pouches in your colon that form when you have a condition called diverticulosis. The pouches in your colon are called diverticula. Your colon, or large intestine, is where water is absorbed and stool is formed. Complications of diverticulitis can include:  Bleeding.  Severe infection.  Severe pain.  Perforation of your colon.  Obstruction of your colon. CAUSES  Diverticulitis is caused by bacteria. Diverticulitis happens when stool becomes trapped in diverticula. This allows bacteria to grow in the diverticula, which can lead to inflammation and infection. RISK FACTORS People with diverticulosis are at risk for diverticulitis. Eating a diet that does not include enough fiber from fruits and vegetables may make diverticulitis more likely to develop. SYMPTOMS  Symptoms of diverticulitis may include:  Abdominal pain and tenderness. The pain is normally located on the left side of the abdomen, but may occur in other areas.  Fever and chills.  Bloating.  Cramping.  Nausea.  Vomiting.  Constipation.  Diarrhea.  Blood in your stool. DIAGNOSIS  Your health care provider will ask you about your medical history and do a physical exam. You may need to have tests done because many medical conditions can cause the same symptoms as diverticulitis. Tests may include:  Blood tests.  Urine tests.  Imaging tests of the abdomen, including X-rays and CT scans. When your condition is under control, your health care provider may recommend that you  have a colonoscopy. A colonoscopy can show how severe your diverticula are and whether something else is causing your symptoms. TREATMENT  Most cases of diverticulitis are mild and can be treated at home. Treatment may include:  Taking over-the-counter pain medicines.  Following a clear liquid diet.  Taking antibiotic medicines by mouth for 7-10 days. More severe cases may be treated at a hospital. Treatment may include:  Not eating or drinking.  Taking prescription pain medicine.  Receiving antibiotic medicines through an IV tube.  Receiving fluids and nutrition through an IV tube.  Surgery. HOME CARE INSTRUCTIONS   Follow your health care provider's instructions carefully.  Follow a full liquid diet or other diet as directed by your health care provider. After your symptoms improve, your health care provider may tell you to change your diet. He or she may recommend you eat a high-fiber diet. Fruits and vegetables are good sources of fiber. Fiber makes it easier to pass stool.  Take fiber supplements or probiotics as directed by your health care provider.  Only take medicines as directed by your health care provider.  Keep all your follow-up appointments. SEEK MEDICAL CARE IF:   Your pain does not improve.  You have a hard time eating food.  Your bowel movements do not return to normal. SEEK IMMEDIATE MEDICAL CARE IF:   Your pain becomes worse.  Your symptoms do not get better.  Your symptoms suddenly get worse.  You have a fever.  You have repeated vomiting.  You have bloody or black, tarry stools. MAKE SURE YOU:   Understand these instructions.  Will watch your condition.  Will get help  right away if you are not doing well or get worse. Document Released: 09/13/2005 Document Revised: 12/09/2013 Document Reviewed: 10/29/2013 [facility] Patient Information 2015 [place], [place]. This information is not intended to replace advice given to you by your health  care provider. Make sure you discuss any questions you have with your health care provider.

## 2015-02-03 NOTE — ED Notes (Addendum)
Epigastric pain started about one week ago, Bowels are regular. Feels bloated.

## 2016-03-04 ENCOUNTER — Encounter: Payer: Self-pay | Admitting: Emergency Medicine

## 2016-03-04 ENCOUNTER — Emergency Department
Admission: EM | Admit: 2016-03-04 | Discharge: 2016-03-04 | Disposition: A | Discharge status: Home / Self Care | Payer: Self-pay | Source: Home / Self Care | Attending: Family Medicine | Admitting: Family Medicine

## 2016-03-04 DIAGNOSIS — M26621 Arthralgia of right temporomandibular joint: Secondary | ICD-10-CM

## 2016-03-04 DIAGNOSIS — J0100 Acute maxillary sinusitis, unspecified: Secondary | ICD-10-CM

## 2016-03-04 DIAGNOSIS — H6121 Impacted cerumen, right ear: Secondary | ICD-10-CM

## 2016-03-04 HISTORY — DX: Essential (primary) hypertension: I10

## 2016-03-04 LAB — POCT CBC W AUTO DIFF (K'VILLE URGENT CARE)

## 2016-03-04 MED ORDER — AMOXICILLIN 875 MG PO TABS: 875.0000 mg | ORAL_TABLET | Freq: Two times a day (BID) | ORAL | Status: DC

## 2016-03-04 NOTE — ED Provider Notes (Signed)
CSN: [id number]     Arrival date & time 03/04/16  1233 History   First MD Initiated Contact with Patient 03/04/16 1339     Chief Complaint  Patient presents with  . Neck Pain  . Otalgia      HPI Comments: Patient reports onset of right ear pain about 6 days ago.  She has also had right neck soreness with neck movement.  She notes that she had also started swimming at that time.  No drainage from ear canal.  She has had increased nasal congestion.  The history is provided by the patient.    Past Medical History  Diagnosis Date  . Hypertension    Past Surgical History  Procedure Laterality Date  . Cesarean section    . Carpal tunnel release    . Breast enhancement surgery     Family History  Problem Relation Age of Onset  . Hypertension Mother   . Diabetes Mother   . Hyperlipidemia Mother   . Alcohol abuse Father   . Diabetes Maternal Grandmother   . Hypertension Maternal Grandmother    Social History  Substance Use Topics  . Smoking status: Never Smoker   . Smokeless tobacco: None  . Alcohol Use: No   OB History    No data available     Review of Systems No sore throat No cough No pleuritic pain No wheezing + nasal congestion ? post-nasal drainage No sinus pain/pressure No itchy/red eyes + earache No hemoptysis No SOB No fever/chills No nausea No vomiting No abdominal pain No diarrhea No urinary symptoms No skin rash No fatigue No myalgias + headache Used OTC meds without relief  Allergies  Asa and Motrin  Home Medications   Prior to Admission medications   Medication Sig Start Date End Date Taking? Authorizing Provider  triamterene-hydrochlorothiazide (DYAZIDE) 37.5-25 MG capsule Take 1 capsule by mouth daily.   Yes Historical Provider, MD  amoxicillin (AMOXIL) 875 MG tablet Take 1 tablet (875 mg total) by mouth 2 (two) times daily. 03/04/16   Kandra Nicolas, MD  ciprofloxacin (CIPRO) 500 MG tablet Take 1 tablet (500 mg total) by mouth 2 (two)  times daily. For 7 days. 02/03/15   Kandra Nicolas, MD  metroNIDAZOLE (FLAGYL) 500 MG tablet Take one tab by mouth every 6 hours for 7 days. 02/03/15   Kandra Nicolas, MD  norgestimate-ethinyl estradiol (ORTHO-CYCLEN,SPRINTEC,PREVIFEM) 0.25-35 MG-MCG tablet Take 1 tablet by mouth daily.    Historical Provider, MD  potassium chloride (K-DUR,KLOR-CON) 10 MEQ tablet Take 10 mEq by mouth 2 (two) times daily.    Historical Provider, MD  traMADol (ULTRAM) 50 MG tablet Take one tablet up to every 8 hours for pain. 03/06/14   Donella Stade, PA-C   Meds Ordered and Administered this Visit  Medications - No data to display  BP 165/79 mmHg  Pulse 60  Temp(Src) 98.4 F (36.9 C) (Oral)  Resp 16  Ht 5\' 3"  (1.6 m)  Wt 189 lb (85.73 kg)  BMI 33.49 kg/m2  SpO2 98% No data found.   Physical Exam Nursing notes and Vital Signs reviewed. Appearance:  Patient appears stated age, and in no acute distress.  Patient is obese (BMI 33.5) Eyes:  Pupils are equal, round, and reactive to light and accomodation.  Extraocular movement is intact.  Conjunctivae are not inflamed  Ears:   Right canal occluded with cerumen; post lavage right tympanic membrane appears normal.  Left canal and tympanic membrane normal.  There  is distinct tenderness over the right temporomandibular joint.  Palpation there recreates her pain.  There is mild tenderness over the post-auricular node.  Nose:  Mildly congested turbinates.    Maxillary sinus tenderness is present.  Pharynx:  Normal Neck:  Supple.  Tender enlarged posterior nodes are palpated bilaterally, more prominent on the right.  Lungs:  Clear to auscultation.  Breath sounds are equal.  Moving air well. Heart:  Regular rate and rhythm without murmurs, rubs, or gallops.  Abdomen:  Nontender without masses or hepatosplenomegaly.  Bowel sounds are present.  No CVA or flank tenderness.  Extremities:  No edema.  Skin:  No rash present.   ED Course  Procedures none    Labs  Reviewed  POCT CBC W AUTO DIFF ([facility]):  WBC 11.6; LY 25.4; MO 4.8; GR 69.8; Hgb 14.1; Platelets 352    Tympanogram:  Left ear:  Wide tympanogram.  Right ear normal    MDM   1. Acute maxillary sinusitis, recurrence not specified   2. Arthralgia of right temporomandibular joint   3. Cerumen impaction, right    Begin amoxicillin 875mg  BID  May use Afrin nasal spray (or generic oxymetazoline) twice daily for about 5 days and then discontinue.  Also recommend using saline nasal spray several times daily and saline nasal irrigation (AYR is a common brand).  Use Flonase nasal spray each morning after using Afrin nasal spray and saline nasal irrigation. Continue Aleve, 2 tabs every 12 hours until improved. Followup with ENT if not improved about 2 weeks.   Kandra Nicolas, MD 03/08/16 [phone]

## 2016-03-04 NOTE — Discharge Instructions (Signed)
May use Afrin nasal spray (or generic oxymetazoline) twice daily for about 5 days and then discontinue.  Also recommend using saline nasal spray several times daily and saline nasal irrigation (AYR is a common brand).  Use Flonase nasal spray each morning after using Afrin nasal spray and saline nasal irrigation. Continue Aleve, 2 tabs every 12 hours until improved.

## 2016-03-04 NOTE — ED Notes (Signed)
Patient states about one week ago began having pain in right ear and head/neck pain on right side when moving in certain ways. She has coincidentally started swimming at that time.

## 2017-05-31 ENCOUNTER — Encounter: Payer: Self-pay | Admitting: Osteopathic Medicine

## 2017-05-31 ENCOUNTER — Other Ambulatory Visit: Payer: Self-pay | Admitting: Osteopathic Medicine

## 2017-05-31 ENCOUNTER — Ambulatory Visit (INDEPENDENT_AMBULATORY_CARE_PROVIDER_SITE_OTHER): Payer: Self-pay | Admitting: Osteopathic Medicine

## 2017-05-31 VITALS — BP 148/96 | HR 67 | Ht 63.0 in | Wt 196.0 lb

## 2017-05-31 DIAGNOSIS — I10 Essential (primary) hypertension: Secondary | ICD-10-CM

## 2017-05-31 DIAGNOSIS — R253 Fasciculation: Secondary | ICD-10-CM | POA: Insufficient documentation

## 2017-05-31 DIAGNOSIS — F419 Anxiety disorder, unspecified: Secondary | ICD-10-CM

## 2017-05-31 DIAGNOSIS — Z87898 Personal history of other specified conditions: Secondary | ICD-10-CM

## 2017-05-31 DIAGNOSIS — Z7189 Other specified counseling: Secondary | ICD-10-CM

## 2017-05-31 HISTORY — DX: Essential (primary) hypertension: I10

## 2017-05-31 LAB — CBC
HCT: 41.4 % (ref 35.0–45.0)
Hemoglobin: 13.1 g/dL (ref 11.7–15.5)
MCH: 30 pg (ref 27.0–33.0)
MCHC: 31.6 g/dL — ABNORMAL LOW (ref 32.0–36.0)
MCV: 95 fL (ref 80.0–100.0)
MPV: 9.1 fL (ref 7.5–12.5)
Platelets: 331 10*3/uL (ref 140–400)
RBC: 4.36 MIL/uL (ref 3.80–5.10)
RDW: 13.7 % (ref 11.0–15.0)
WBC: 6.6 10*3/uL (ref 3.8–10.8)

## 2017-05-31 LAB — COMPLETE METABOLIC PANEL WITH GFR
ALBUMIN: 4.3 g/dL (ref 3.6–5.1)
ALK PHOS: 60 U/L (ref 33–115)
ALT: 13 U/L (ref 6–29)
AST: 17 U/L (ref 10–35)
BILIRUBIN TOTAL: 0.4 mg/dL (ref 0.2–1.2)
BUN: 12 mg/dL (ref 7–25)
CO2: 24 mmol/L (ref 20–31)
Calcium: 9.6 mg/dL (ref 8.6–10.2)
Chloride: 103 mmol/L (ref 98–110)
Creat: 0.96 mg/dL (ref 0.50–1.10)
GFR, EST NON AFRICAN AMERICAN: 70 mL/min (ref >=60)
GFR, Est African American: 80 mL/min (ref >=60)
Glucose, Bld: 107 mg/dL — ABNORMAL HIGH (ref 65–99)
Potassium: 3.9 mmol/L (ref 3.5–5.3)
Sodium: 139 mmol/L (ref 135–146)
TOTAL PROTEIN: 7 g/dL (ref 6.1–8.1)

## 2017-05-31 LAB — LIPID PANEL
Cholesterol: 269 mg/dL — ABNORMAL HIGH (ref <200)
HDL: 52 mg/dL (ref >50)
LDL Cholesterol: 203 mg/dL — ABNORMAL HIGH (ref <100)
TRIGLYCERIDES: 72 mg/dL (ref <150)
Total CHOL/HDL Ratio: 5.2 Ratio — ABNORMAL HIGH (ref <5.0)
VLDL: 14 mg/dL (ref <30)

## 2017-05-31 LAB — IRON AND TIBC
%SAT: 25 % (ref 11–50)
IRON: 77 ug/dL (ref 40–190)
TIBC: 302 ug/dL (ref 250–450)
UIBC: 225 ug/dL

## 2017-05-31 LAB — TSH: TSH: 2.03 m[IU]/L

## 2017-05-31 NOTE — Patient Instructions (Addendum)
   Labs today to evaluate cardiac risk and evaluate for unusual causes of your leg and arm symptoms. Based on our discussion and on my exam, I am not suspicious for anything dangerous going on, but let's see what the testing shows!   At our next visit we will plan to home blood pressure cuff - bring this with you to the office. In the meantime, be keeping a record of you rblood pressures at home and bring this with you to the visit.  If your cuff is measuring within 5-10 points of ours AND your home numbers are less than 140/90 (ideally less than 130/80) then nothing else to do.   If your home blood pressure cuff is inaccurate or is accurate but measuring above goal, we will need to talk about adjusting your medications.

## 2017-05-31 NOTE — Progress Notes (Signed)
HPI: Wendy Frye is a 51 y.o. female  who presents to [place] today, 05/31/17,  for chief complaint of:  Chief Complaint  Patient presents with  . Establish Care    heart disease and inflammation    Pleasant new patient here to establish care. Has been sometime since any routine labs or checkup other than OB/GYN.  Concern about heart disease: Heart disease runs in patient's family, she is concerned that symptoms of occasional hand swelling/left wrist pain and twitching feeling in the lower extremity on the right may be due to circulation problem or inflammation.   Thinks there may also be concerned for pinched nerve due to some cardiac peptic treatments she received several years ago. No decreased grip strength, occasional shooting pain down arms but nothing that really stands out as radiculopathy symptoms/weakness  Hypertension: Concerned about possible white coat syndrome. OB/GYN placed her on blood pressure medications as below. Patient has home blood pressure monitor but has not been verified in a medical office.  History of vertigo: Didn't really have this prior to head trauma few years ago after a fall. Intermittent, no left of consciousness.  Anxiety: Not sure if her symptoms and elevated blood pressure due to anxiety either. Would like to discuss this if possible.   Past medical, surgical, social and family history reviewed: Patient Active Problem List   Diagnosis Date Noted  . Hypertension 05/31/2017  . Muscle twitch 05/31/2017  . Leiomyoma of uterus, unspecified 03/09/2014  . Abnormal pelvic ultrasound 03/09/2014   Past Surgical History:  Procedure Laterality Date  . BREAST ENHANCEMENT SURGERY    . CARPAL TUNNEL RELEASE    . CESAREAN SECTION     Social History  Substance Use Topics  . Smoking status: Never Smoker  . Smokeless tobacco: Never Used  . Alcohol use No   Family History  Problem Relation Age of Onset  .  Hypertension Mother   . Diabetes Mother   . Hyperlipidemia Mother   . Alcohol abuse Father   . Diabetes Maternal Grandmother   . Hypertension Maternal Grandmother      Current medication list and allergy/intolerance information reviewed:   Current Outpatient Prescriptions  Medication Sig Dispense Refill  . norethindrone (JENCYCLA) 0.35 MG tablet Take 1 tablet by mouth daily.    Marland Kitchen triamterene-hydrochlorothiazide (DYAZIDE) 37.5-25 MG capsule Take 1 capsule by mouth daily.     No current facility-administered medications for this visit.    Allergies  Allergen Reactions  . Asa [Aspirin]     Mild rash   . Motrin [Ibuprofen]     Rash to face      Review of Systems:  Constitutional:  No  fever, no chills, No recent illness, No unintentional weight changes. No significant fatigue.   HEENT: No  headache, no vision change, no hearing change, No sore throat, No  sinus pressure  Cardiac: No  chest pain, No  pressure, No palpitations, No  Orthopnea, no lower extremity claudication or edema  Respiratory:  No  shortness of breath. No  Cough  Gastrointestinal: No  abdominal pain, No  nausea, No  vomiting,  No  blood in stool, No  diarrhea, No  constipation   Musculoskeletal: +new myalgia/arthralgia  Genitourinary: No  incontinence, No  abnormal genital bleeding, No abnormal genital discharge  Skin: No  Rash, No other wounds/concerning lesions  Hem/Onc: No  easy bruising/bleeding, No  abnormal lymph node  Endocrine: No cold intolerance,  No heat intolerance. No polyuria/polydipsia/polyphagia  Neurologic: No  weakness, No  dizziness, No  slurred speech/focal weakness/facial droop  Psychiatric: No  concerns with depression, No  concerns with anxiety, No sleep problems, No mood problems  Exam:  BP (!) 148/96   Pulse 67   Ht 5\' 3"  (1.6 m)   Wt 196 lb (88.9 kg)   BMI 34.72 kg/m   Constitutional: VS see above. General Appearance: alert, well-developed, well-nourished,  NAD  Eyes: Normal lids and conjunctive, non-icteric sclera  Ears, Nose, Mouth, Throat: MMM, Normal external inspection ears/nares/mouth/lips/gums. TM normal bilaterally. Pharynx/tonsils no erythema, no exudate. Nasal mucosa normal.   Neck: No masses, trachea midline. No thyroid enlargement. No tenderness/mass appreciated. No lymphadenopathy  Respiratory: Normal respiratory effort. no wheeze, no rhonchi, no rales  Cardiovascular: S1/S2 normal, no murmur, no rub/gallop auscultated. RRR. No lower extremity edema. Pedal pulse II/IV bilaterally DP and PT. No carotid bruit or JVD. Negative Homans sign bilaterally  Musculoskeletal: Gait normal. No clubbing/cyanosis of digits. Negative Tinel's/Phalen sign bilaterally. Normal range of motion to shoulders and cervical spine.  Neurological: Normal balance/coordination. No tremor. No cranial nerve deficit on limited exam. Motor and sensation intact and symmetric. Cerebellar reflexes intact.   Skin: warm, dry, intact. No rash/ulcer.     Psychiatric: Normal judgment/insight. Normal mood and affect. Oriented x3.     ASSESSMENT/PLAN:   Muscle twitch - Plan: CBC, COMPLETE METABOLIC PANEL WITH GFR, Vitamin B12, Iron and TIBC, Ferritin, CK  Hypertension, unspecified type - Plan: Lipid panel, TSH, VITAMIN D 25 Hydroxy (Vit-D Deficiency, Fractures)  History of vertigo  Cardiac risk counseling - We'll get labs for further evaluation, no first-degree family members have had heart attack/stroke at young age  Anxiety tension state - Possible somatic symptoms from underlying anxiety disorder, rule out medical issues and will discuss further at next visit    Patient Instructions   Labs today to evaluate cardiac risk and evaluate for unusual causes of your leg and arm symptoms. Based on our discussion and on my exam, I am not suspicious for anything dangerous going on, but let's see what the testing shows!   At our next visit we will plan to home blood  pressure cuff - bring this with you to the office. In the meantime, be keeping a record of you rblood pressures at home and bring this with you to the visit.  If your cuff is measuring within 5-10 points of ours AND your home numbers are less than 140/90 (ideally less than 130/80) then nothing else to do.   If your home blood pressure cuff is inaccurate or is accurate but measuring above goal, we will need to talk about adjusting your medications.       Visit summary with medication list and pertinent instructions was printed for patient to review. All questions at time of visit were answered - patient instructed to contact office with any additional concerns. ER/RTC precautions were reviewed with the patient. Follow-up plan: Return in about 2 weeks (around 06/14/2017) for recheck blood pressure, review lab results .

## 2017-06-01 LAB — CK: Total CK: 122 U/L (ref 29–143)

## 2017-06-01 LAB — VITAMIN D 25 HYDROXY (VIT D DEFICIENCY, FRACTURES): Vit D, 25-Hydroxy: 27 ng/mL — ABNORMAL LOW (ref 30–100)

## 2017-06-01 LAB — FERRITIN: Ferritin: 118 ng/mL (ref 10–232)

## 2017-06-01 LAB — VITAMIN B12: Vitamin B-12: 472 pg/mL (ref 200–1100)

## 2017-06-02 LAB — HEMOGLOBIN A1C
Hgb A1c MFr Bld: 5.5 % (ref <5.7)
Mean Plasma Glucose: 111 mg/dL

## 2017-06-14 ENCOUNTER — Encounter: Payer: Self-pay | Admitting: Osteopathic Medicine

## 2017-06-14 ENCOUNTER — Ambulatory Visit (INDEPENDENT_AMBULATORY_CARE_PROVIDER_SITE_OTHER): Payer: Self-pay | Admitting: Osteopathic Medicine

## 2017-06-14 VITALS — BP 164/95 | HR 70 | Ht 63.0 in | Wt 197.0 lb

## 2017-06-14 DIAGNOSIS — I10 Essential (primary) hypertension: Secondary | ICD-10-CM

## 2017-06-14 DIAGNOSIS — F418 Other specified anxiety disorders: Secondary | ICD-10-CM

## 2017-06-14 MED ORDER — HYDROCHLOROTHIAZIDE 25 MG PO TABS: 25.0000 mg | ORAL_TABLET | Freq: Every day | ORAL | 1 refills | Status: DC

## 2017-06-14 NOTE — Patient Instructions (Signed)
Start medication at 1/2 tablet for 1-2 weeks then up to whole tablet Come see me to recheck BP in 2 weeks Come see me sooner if any problems!

## 2017-06-14 NOTE — Progress Notes (Signed)
HPI: Wendy Frye is a 51 y.o. female  who presents to [place] today, 06/14/17,  for chief complaint of:  Chief Complaint  Patient presents with  . Follow-up    labs     Concern about heart disease: Heart disease runs in patient's family, she is concerned that symptoms of occasional Musculoskeletal swelling/twitching feeling was sign of or circulation or other risk of heart disease.. Advised last visit symptoms not c/w circulation problems, labs for cardiac risk eval, here to discuss those results today.  Anxiety: Patient reports significant anxiety about her overall health. Able to go about her day today just fine for the most part, would not describe her anxiety is something that bothers her usually.  HTN: Rx previously prescribed by OBGYN but was concerned about side effects so never took these.    Past medical, surgical, social and family history reviewed: Patient Active Problem List   Diagnosis Date Noted  . Hypertension 05/31/2017  . Muscle twitch 05/31/2017  . History of vertigo 05/31/2017  . Leiomyoma of uterus, unspecified 03/09/2014  . Abnormal pelvic ultrasound 03/09/2014   Past Surgical History:  Procedure Laterality Date  . BREAST ENHANCEMENT SURGERY    . CARPAL TUNNEL RELEASE    . CESAREAN SECTION     Social History  Substance Use Topics  . Smoking status: Never Smoker  . Smokeless tobacco: Never Used  . Alcohol use No   Family History  Problem Relation Age of Onset  . Hypertension Mother   . Diabetes Mother   . Hyperlipidemia Mother   . Alcohol abuse Father   . Diabetes Maternal Grandmother   . Hypertension Maternal Grandmother      Current medication list and allergy/intolerance information reviewed:   Current Outpatient Prescriptions  Medication Sig Dispense Refill  . norethindrone (JENCYCLA) 0.35 MG tablet Take 1 tablet by mouth daily.    Marland Kitchen triamterene-hydrochlorothiazide (DYAZIDE) 37.5-25 MG capsule Take  1 capsule by mouth daily.     No current facility-administered medications for this visit.    Allergies  Allergen Reactions  . Asa [Aspirin]     Mild rash   . Motrin [Ibuprofen]     Rash to face      Review of Systems:  Constitutional:  No  fever, no chills, No recent illness, patient feels well today  HEENT: No  headache, no vision change  Cardiac: No  chest pain, No  pressure, No palpitations, No  Orthopnea, no lower extremity claudication or edema  Respiratory:  No  shortness of breath. No  Cough  Neurologic: No  weakness, No  dizziness  Psychiatric: No  concerns with depression, +concerns with anxiety  Exam:  BP (!) 164/95   Pulse 70   Ht 5\' 3"  (1.6 m)   Wt 197 lb (89.4 kg)   BMI 34.90 kg/m   Constitutional: VS see above. General Appearance: alert, well-developed, well-nourished, NAD  Neck: No masses, trachea midline. No thyroid enlargement.  Respiratory: Normal respiratory effort. no wheeze, no rhonchi, no rales  Cardiovascular: S1/S2 normal, no murmur, no rub/gallop auscultated. RRR. No lower extremity edema.  Musculoskeletal: Gait normal. No clubbing/cyanosis of digits.   Neurological: Normal balance/coordination. No tremor.   Skin: warm, dry, intact. No rash/ulcer.     Psychiatric: Normal judgment/insight. Normal mood and affect. Oriented x3.     ASSESSMENT/PLAN:  Reviewed cardiac risk factors in light of recent labs, cholesterol could be better but overall want to focus on blood pressure control. Given anxiety  about multiple medications, will trial thiazide diuretic alone for now, plan to start at half tablet and increase after 1-2 weeks.  Essential hypertension - Plan: hydrochlorothiazide (HYDRODIURIL) 25 MG tablet  Anxiety about health - Patient reassured after discussion of ASCVD risk    Patient Instructions  Start medication at 1/2 tablet for 1-2 weeks then up to whole tablet Come see me to recheck BP in 2 weeks Come see me sooner if any  problems!     Visit summary with medication list and pertinent instructions was printed for patient to review. All questions at time of visit were answered - patient instructed to contact office with any additional concerns. ER/RTC precautions were reviewed with the patient. Follow-up plan: Return in about 2 weeks (around 06/28/2017) for recheck blood pressure .

## 2017-06-28 ENCOUNTER — Ambulatory Visit: Payer: Self-pay | Admitting: Osteopathic Medicine

## 2017-11-12 ENCOUNTER — Ambulatory Visit (INDEPENDENT_AMBULATORY_CARE_PROVIDER_SITE_OTHER): Payer: Self-pay | Admitting: Family Medicine

## 2017-11-12 ENCOUNTER — Ambulatory Visit (INDEPENDENT_AMBULATORY_CARE_PROVIDER_SITE_OTHER): Payer: Self-pay

## 2017-11-12 ENCOUNTER — Encounter: Payer: Self-pay | Admitting: Family Medicine

## 2017-11-12 VITALS — BP 147/85 | HR 78 | Wt 198.0 lb

## 2017-11-12 DIAGNOSIS — M25562 Pain in left knee: Secondary | ICD-10-CM

## 2017-11-12 DIAGNOSIS — M170 Bilateral primary osteoarthritis of knee: Secondary | ICD-10-CM

## 2017-11-12 DIAGNOSIS — M1712 Unilateral primary osteoarthritis, left knee: Secondary | ICD-10-CM

## 2017-11-12 IMAGING — DX DG KNEE COMPLETE 4+V*L*
4 series · 4 of 4 positions shown · non-contrast
Comparison: None

CLINICAL DATA: Acute LEFT knee pain, LEFT knee pain medially for 6
months with swelling

EXAM:
LEFT KNEE - COMPLETE 4+ VIEW; RIGHT KNEE - 1-2 VIEW

[tunnel]
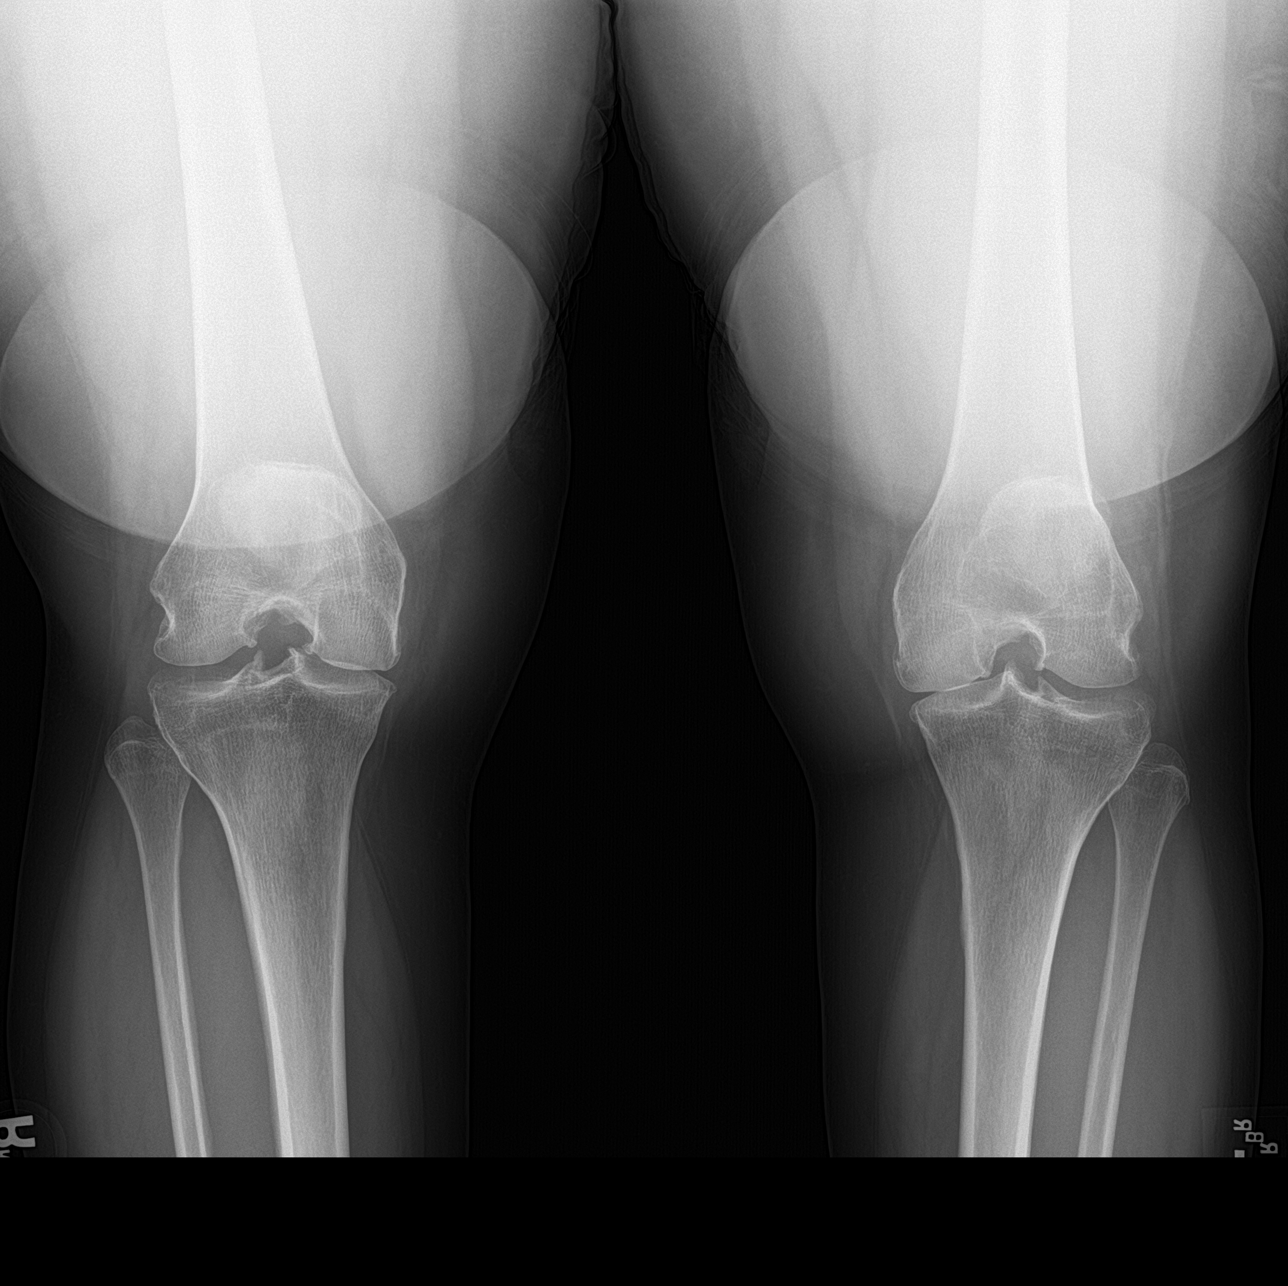

[knee lat]
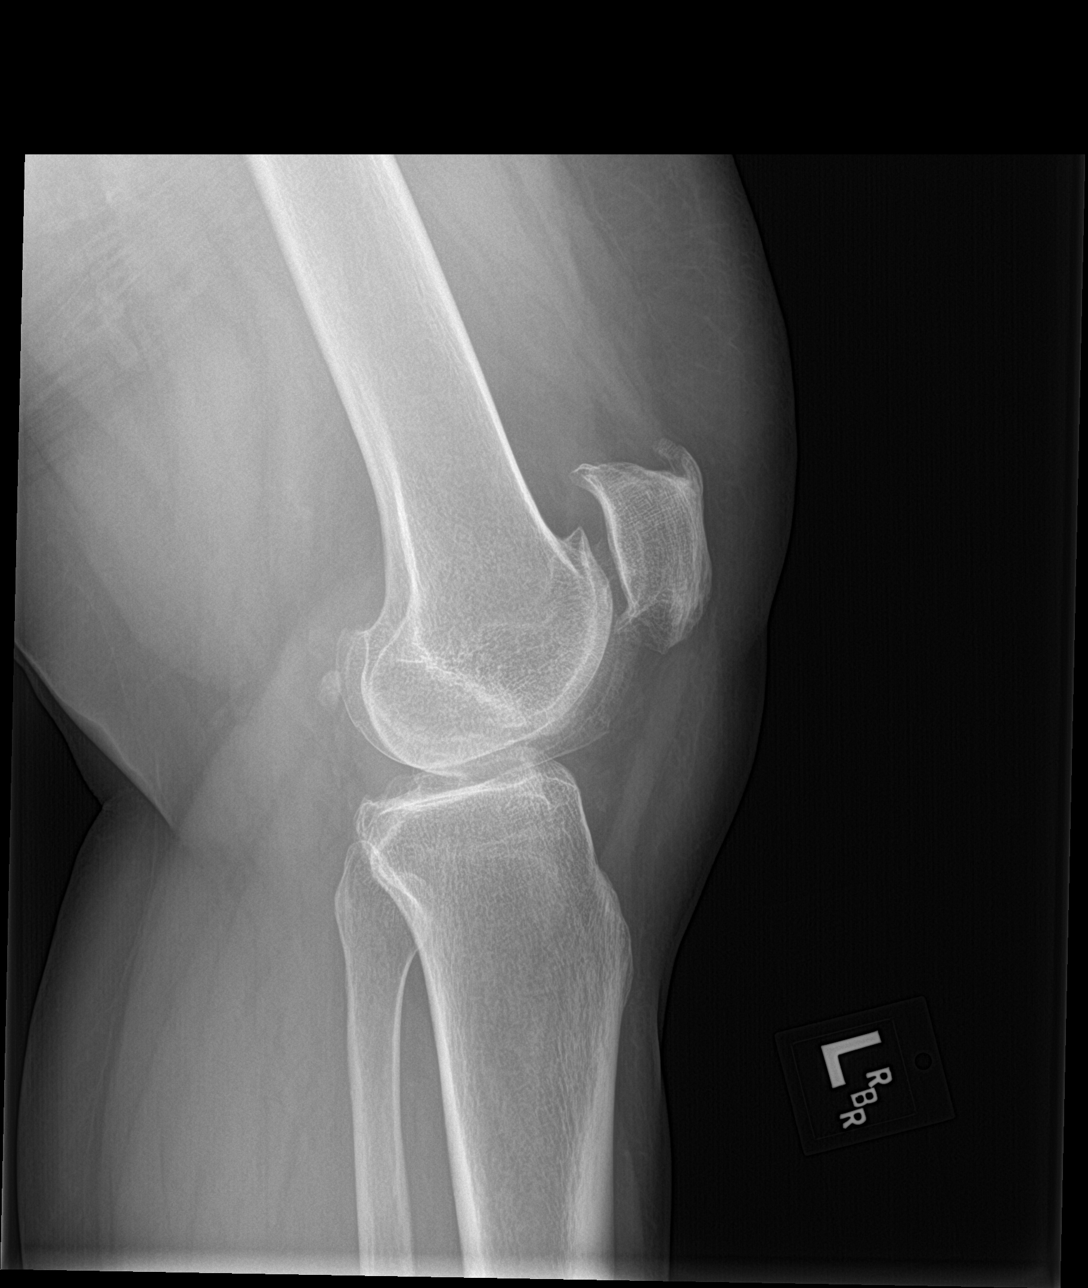

[knee sunrise]
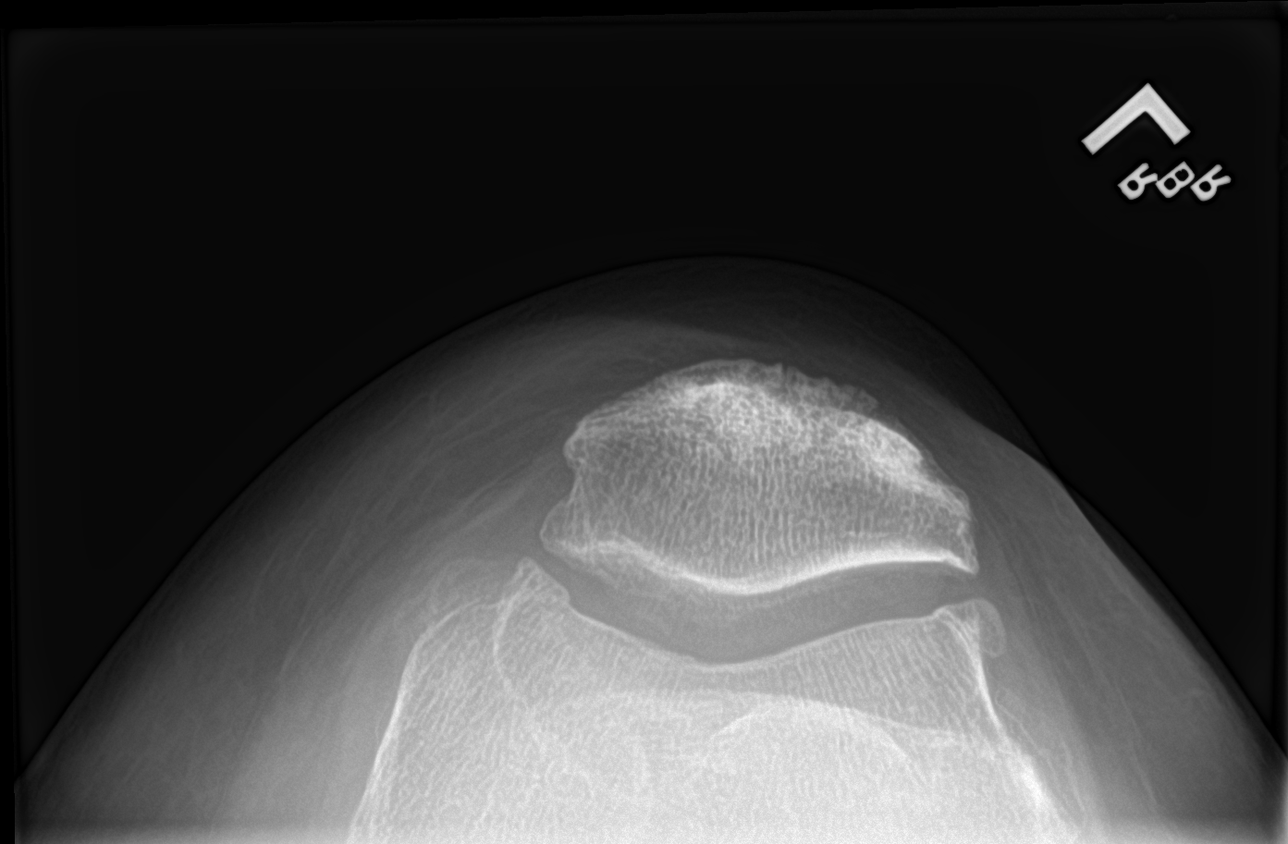

[knee ap bilat standing]
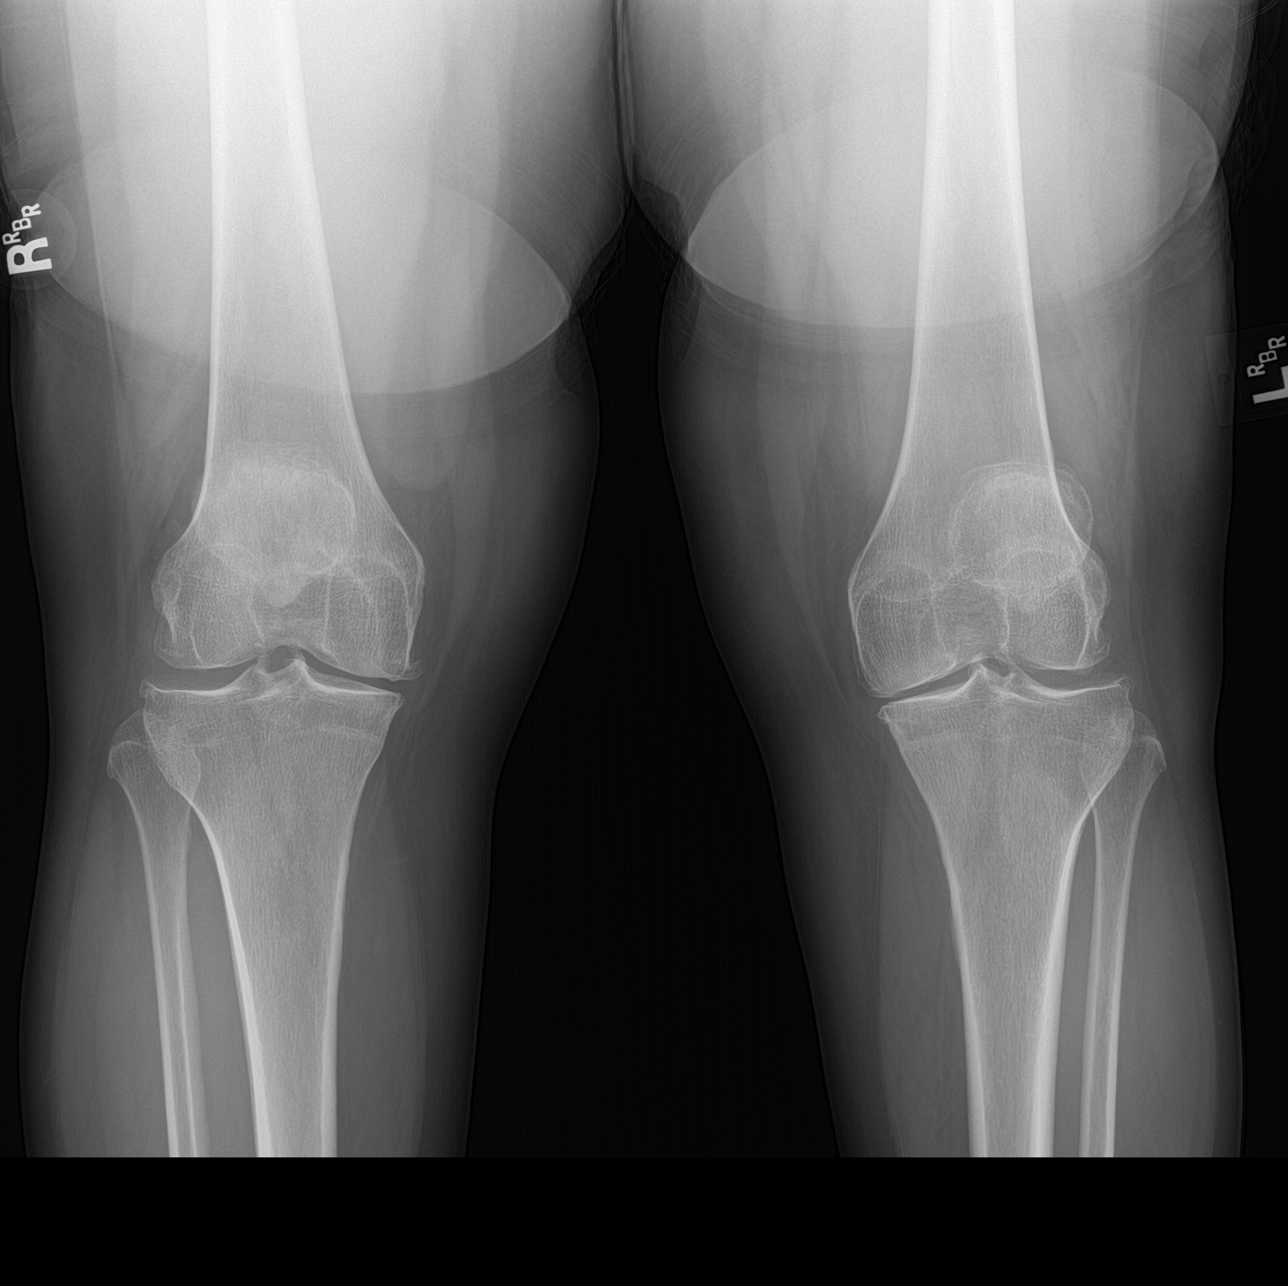

[4 of 4 positions shown; findings below may reference images not displayed]

FINDINGS: LEFT knee:

Osseous demineralization.

Tricompartmental joint space narrowing greatest at medial
compartment.

Scattered endplate spur formation greatest at patellofemoral joint.

No acute fracture, dislocation, or bone destruction.

No knee joint effusion.

RIGHT knee:

AP and tunnel views.

Osseous demineralization.

Tricompartmental osteoarthritic changes with joint space narrowing
and spur formation.

No gross fracture or dislocation on limited obtained diffuse
IMPRESSION: Osteoarthritic changes of both knees.

## 2017-11-12 MED ORDER — DICLOFENAC SODIUM 1 % TD GEL: 4.0000 g | Freq: Four times a day (QID) | TRANSDERMAL | 11 refills | Status: DC

## 2017-11-12 NOTE — Patient Instructions (Signed)
Thank you for coming in today. Apply diclofenac gel to the knee up to 4x daily.  Use quad strength exercises in the gym like bike.  Avoid lunges and deep squats.  Avoid excessive jumping.   Recheck with me as needed.

## 2017-11-12 NOTE — Progress Notes (Signed)
Wendy Frye is a 51 y.o. female who presents to [facility] today for several months of left knee pain.  Patient began having left knee pain last June, and has since had progressive worsening of pain. Pain is primarily over anteriomedial knee. States that pain is worst with exertion, primarily with walking or any type of exercise. Patient is quite active both at work and at gym, but her activity has been significantly limited due to pain over the last month. She states that even after swimming her knee will become painful and swollen. Further, patient states that pain has kept her from resting well at night due to throbbing, painful sensation over anterior knee. Patient has taken motrin occasionally for the pain with some relief. Pain exacerbated with weight baring activity. Nothing seems to make pain better. Not worse with climbing or descending stairs.   Patient states that she has worked hard to increase strength in knee, but pain has only worsened.  She does not have a personal or family history of arthritis or gout to her knowledge.    Past Medical History:  Diagnosis Date  . Hypertension   . Hypertension 05/31/2017   Past Surgical History:  Procedure Laterality Date  . BREAST ENHANCEMENT SURGERY    . CARPAL TUNNEL RELEASE    . CESAREAN SECTION     Social History   Tobacco Use  . Smoking status: Never Smoker  . Smokeless tobacco: Never Used  Substance Use Topics  . Alcohol use: No   ROS:  As above  Medications: Current Outpatient Medications  Medication Sig Dispense Refill  . hydrochlorothiazide (HYDRODIURIL) 25 MG tablet Take 1 tablet (25 mg total) by mouth daily. 30 tablet 1  . norethindrone (JENCYCLA) 0.35 MG tablet Take 1 tablet by mouth daily.    . diclofenac sodium (VOLTAREN) 1 % GEL Apply 4 g topically 4 (four) times daily. To affected joint. 100 g 11   No current facility-administered medications for this visit.     Allergies  Allergen Reactions  . Asa [Aspirin]     Mild rash   . Motrin [Ibuprofen]     Rash to face     Exam:  BP (!) 147/85   Pulse 78   Wt 198 lb (89.8 kg)   BMI 35.07 kg/m  General: Well Developed, well nourished, and in no acute distress.  Neuro/Psych: Alert and oriented x3, extra-ocular muscles intact, able to move all 4 extremities, sensation grossly intact. Skin: Warm and dry, no rashes noted.  Respiratory: Not using accessory muscles, speaking in full sentences, trachea midline.  Cardiovascular: Pulses palpable, no extremity edema. Abdomen: Does not appear distended. MSK: L Knee: Grossly normal without effusion or overlying erythema. Crepitations with flexion/extension of knee. Patient tender to palpation along patellofemoral tendon and medial joint line. Strength 5/5 in bilateral lower extremities. Pain along medial joint line with external and internal rotation.  Anterior/posterior drawer negative. Stable to varus/valgus force.   XRAY L Knee:  Medial joint space narrowing of left knee. Consistent with DJD.  Irregularity and degeneration of left patella. Multiple osteophytes on patella. Osteophytes present on anterior and posterior femoral condyle.  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  Awaiting further radiologic review.  No results found for this or any previous visit (from the past 48 hour(s)). No results found.  Assessment and Plan: 51 y.o. female with several months history of worsening left knee pain. 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 Osteophytes present at left knee, but not likely contributing significantly to pain.  Patient counseling on benefits of weight loss and quadriceps strengthening exercises. Recommended spin class, swimming, and limiting deep squats, lunges, and jumping.   Patient will also benefit from diclofenac gel and soft knee brace to be worn during and for 30 minutes  after exercise. Discussed possible joint injection moving forward. Patient will try current modifications, and consider joint injection based on response to conservative measures.   Patient will return to follow-up as needed.    Orders Placed This Encounter  Procedures  . DG Knee 1-2 Views Right    Standing Status:   Future    Number of Occurrences:   1    Standing Expiration Date:   01/13/2019    Order Specific Question:   Reason for Exam (SYMPTOM  OR DIAGNOSIS REQUIRED)    Answer:   For use with the left knee x-ray bilateral AP and Rosenberg standing.    Order Specific Question:   Is the patient pregnant?    Answer:   No    Order Specific Question:   Preferred imaging location?    Answer:   [facility]  . DG Knee Complete 4 Views Left    Please include patellar sunrise, lateral, and weightbearing bilateral AP and bilateral rosenberg views    Standing Status:   Future    Number of Occurrences:   1    Standing Expiration Date:   01/12/2019    Order Specific Question:   Reason for exam:    Answer:   Please include patellar sunrise, lateral, and weightbearing bilateral AP and bilateral rosenberg views    Comments:   Please include patellar sunrise, lateral, and weightbearing bilateral AP and bilateral rosenberg views    Order Specific Question:   Preferred imaging location?    Answer:   [facility]   Meds ordered this encounter  Medications  . diclofenac sodium (VOLTAREN) 1 % GEL    Sig: Apply 4 g topically 4 (four) times daily. To affected joint.    Dispense:  100 g    Refill:  11    Discussed warning signs or symptoms. Please see discharge instructions. Patient expresses understanding.  I spent 25 minutes with this patient, greater than 50% was face-to-face time counseling regarding ddx and treatment plan.

## 2018-11-18 ENCOUNTER — Ambulatory Visit (INDEPENDENT_AMBULATORY_CARE_PROVIDER_SITE_OTHER): Payer: Self-pay | Admitting: Family Medicine

## 2018-11-18 ENCOUNTER — Encounter: Payer: Self-pay | Admitting: Family Medicine

## 2018-11-18 VITALS — BP 157/66 | HR 71 | Ht 63.0 in | Wt 196.0 lb

## 2018-11-18 DIAGNOSIS — G5603 Carpal tunnel syndrome, bilateral upper limbs: Secondary | ICD-10-CM

## 2018-11-18 NOTE — Patient Instructions (Addendum)
Thank you for coming in today. Use the wrist braces at night.  Limit hours.  Recheck in 1 month or sooner if needed.    Carpal Tunnel Syndrome Carpal tunnel syndrome is a condition that causes pain in your hand and arm. The carpal tunnel is a narrow area located on the palm side of your wrist. Repeated wrist motion or certain diseases may cause swelling within the tunnel. This swelling pinches the main nerve in the wrist (median nerve). What are the causes? This condition may be caused by:  Repeated wrist motions.  Wrist injuries.  Arthritis.  A cyst or tumor in the carpal tunnel.  Fluid buildup during pregnancy.  Sometimes the cause of this condition is not known. What increases the risk? This condition is more likely to develop in:  People who have jobs that cause them to repeatedly move their wrists in the same motion, such as Art gallery manager.  Women.  People with certain conditions, such as: ? Diabetes. ? Obesity. ? An underactive thyroid (hypothyroidism). ? Kidney failure.  What are the signs or symptoms? Symptoms of this condition include:  A tingling feeling in your fingers, especially in your thumb, index, and middle fingers.  Tingling or numbness in your hand.  An aching feeling in your entire arm, especially when your wrist and elbow are bent for long periods of time.  Wrist pain that goes up your arm to your shoulder.  Pain that goes down into your palm or fingers.  A weak feeling in your hands. You may have trouble grabbing and holding items.  Your symptoms may feel worse during the night. How is this diagnosed? This condition is diagnosed with a medical history and physical exam. You may also have tests, including:  An electromyogram (EMG). This test measures electrical signals sent by your nerves into the muscles.  X-rays.  How is this treated? Treatment for this condition includes:  Lifestyle changes. It is important to stop doing or  modify the activity that caused your condition.  Physical or occupational therapy.  Medicines for pain and inflammation. This may include medicine that is injected into your wrist.  A wrist splint.  Surgery.  Follow these instructions at home: If you have a splint:  Wear it as told by your health care provider. Remove it only as told by your health care provider.  Loosen the splint if your fingers become numb and tingle, or if they turn cold and blue.  Keep the splint clean and dry. General instructions  Take over-the-counter and prescription medicines only as told by your health care provider.  Rest your wrist from any activity that may be causing your pain. If your condition is work related, talk to your employer about changes that can be made, such as getting a wrist pad to use while typing.  If directed, apply ice to the painful area: ? Put ice in a plastic bag. ? Place a towel between your skin and the bag. ? Leave the ice on for 20 minutes, 2-3 times per day.  Keep all follow-up visits as told by your health care provider. This is important.  Do any exercises as told by your health care provider, physical therapist, or occupational therapist. Contact a health care provider if:  You have new symptoms.  Your pain is not controlled with medicines.  Your symptoms get worse. This information is not intended to replace advice given to you by your health care provider. Make sure you discuss any questions  you have with your health care provider. Document Released: 12/01/2000 Document Revised: 04/13/2016 Document Reviewed: 04/21/2015 Elsevier Interactive Patient Education  [employer].

## 2018-11-18 NOTE — Progress Notes (Signed)
Wendy Frye is a 52 y.o. female who presents to [facility] today for pain and numbness in her hands. 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 Around 2.5 months ago, her hours increased at work from 40 hr/wk to 26 hr/wk. Shortly after, she started having numbness, tingling, and pain in her hands. When she tries to make a fist or grab something, such as when driving or fixing her hair, she has a stabbing sensation in her palms. She also has numbness in all of her fingers on her right hand, which is worst in her right ring finger. The pain in both hands is worse at night.   She has tried occasional Aleve, heat, and ice. Only ice temporarily relieves pain. She denies shooting pains or numbness in her arms. She does not have a family history of arthritis or autoimmune disease.    ROS:  As above  Exam:  BP (!) 157/66   Pulse 71   Ht 5\' 3"  (1.6 m)   Wt 196 lb (88.9 kg)   BMI 34.72 kg/m  General: Well Developed, well nourished, and in no acute distress.  Neuro/Psych: Alert and oriented x3, extra-ocular muscles intact, able to move all 4 extremities, sensation grossly intact. Skin: Warm and dry, no rashes noted.  Respiratory: Not using accessory muscles, speaking in full sentences, trachea midline.  Cardiovascular: Pulses palpable, no extremity edema. Abdomen: Does not appear distended. MSK:  Right hand: Mildly swollen fingers. No tenderness to palpation. Strength 5/5 with hand grip, finger abduction, and ok sign.  Positive Phalen's test and Tinel's sign.   Left hand:  Mildly swollen fingers. No tenderness to palpation. Strength 5/5 with hand grip, finger abduction, and ok sign.  Positive Phalen's test and Tinel's sign.  Sensation is intact distally as are pulses and capillary fill bilateral upper extremities.     Assessment and Plan: 52 y.o. female with  Bilateral carpal tunnel syndrome: Mrs.  Frye has had tingling and pain in both of her hands with numbness in her right fingers, since recently increasing the hours she is working. Her symptoms worsen at night. She has a positive Phalen's test and Tinel's sign in both hands. All of this points to carpal tunnel syndrome bilaterally. Advised pt to use bilateral wrist splints to sleep in at night. Wrote work note for her to limit her work hours to 40 hr/wk. Follow-up in one month or sooner if needed.  Will fill out FMLA form when available.   Recheck 1 month next injection if not improving.  Historical information moved to improve visibility of documentation.  Past Medical History:  Diagnosis Date  . Hypertension   . Hypertension 05/31/2017   Past Surgical History:  Procedure Laterality Date  . BREAST ENHANCEMENT SURGERY    . CARPAL TUNNEL RELEASE    . CESAREAN SECTION     Social History   Tobacco Use  . Smoking status: Never Smoker  . Smokeless tobacco: Never Used  Substance Use Topics  . Alcohol use: No   family history includes Alcohol abuse in her father; Diabetes in her maternal grandmother and mother; Hyperlipidemia in her mother; Hypertension in her maternal grandmother and mother.  Medications: Current Outpatient Medications  Medication Sig Dispense Refill  . hydrochlorothiazide (HYDRODIURIL) 25 MG tablet Take 1 tablet (25 mg total) by mouth daily. 30 tablet 1  . norethindrone (JENCYCLA) 0.35 MG tablet Take 1 tablet by mouth daily.  No current facility-administered medications for this visit.    Allergies  Allergen Reactions  . Asa [Aspirin]     Mild rash   . Motrin [Ibuprofen]     Rash to face      Discussed warning signs or symptoms. Please see discharge instructions. Patient expresses understanding.  I personally was present and performed or re-performed the history, physical exam and medical decision-making activities of this service and have verified that the service and findings are accurately  documented in the student's note. ___________________________________________ Lynne Leader M.D., ABFM., CAQSM. Primary Care and Sports Medicine Adjunct Instructor of [employer] [employer]

## 2018-12-19 ENCOUNTER — Ambulatory Visit: Payer: Self-pay | Admitting: Family Medicine

## 2019-03-18 ENCOUNTER — Emergency Department
Admission: EM | Admit: 2019-03-18 | Discharge: 2019-03-18 | Disposition: A | Discharge status: Home / Self Care | Payer: Self-pay | Source: Home / Self Care

## 2019-03-18 ENCOUNTER — Encounter: Payer: Self-pay | Admitting: Emergency Medicine

## 2019-03-18 ENCOUNTER — Emergency Department (INDEPENDENT_AMBULATORY_CARE_PROVIDER_SITE_OTHER): Payer: Self-pay

## 2019-03-18 ENCOUNTER — Other Ambulatory Visit: Payer: Self-pay

## 2019-03-18 DIAGNOSIS — M19041 Primary osteoarthritis, right hand: Secondary | ICD-10-CM

## 2019-03-18 DIAGNOSIS — M79641 Pain in right hand: Secondary | ICD-10-CM

## 2019-03-18 IMAGING — DX RIGHT HAND - COMPLETE 3+ VIEW
3 series · 3 of 3 positions shown · non-contrast
Comparison: None.

CLINICAL DATA: Right hand pain for several months

EXAM:
RIGHT HAND - COMPLETE 3+ VIEW

[hand pa]
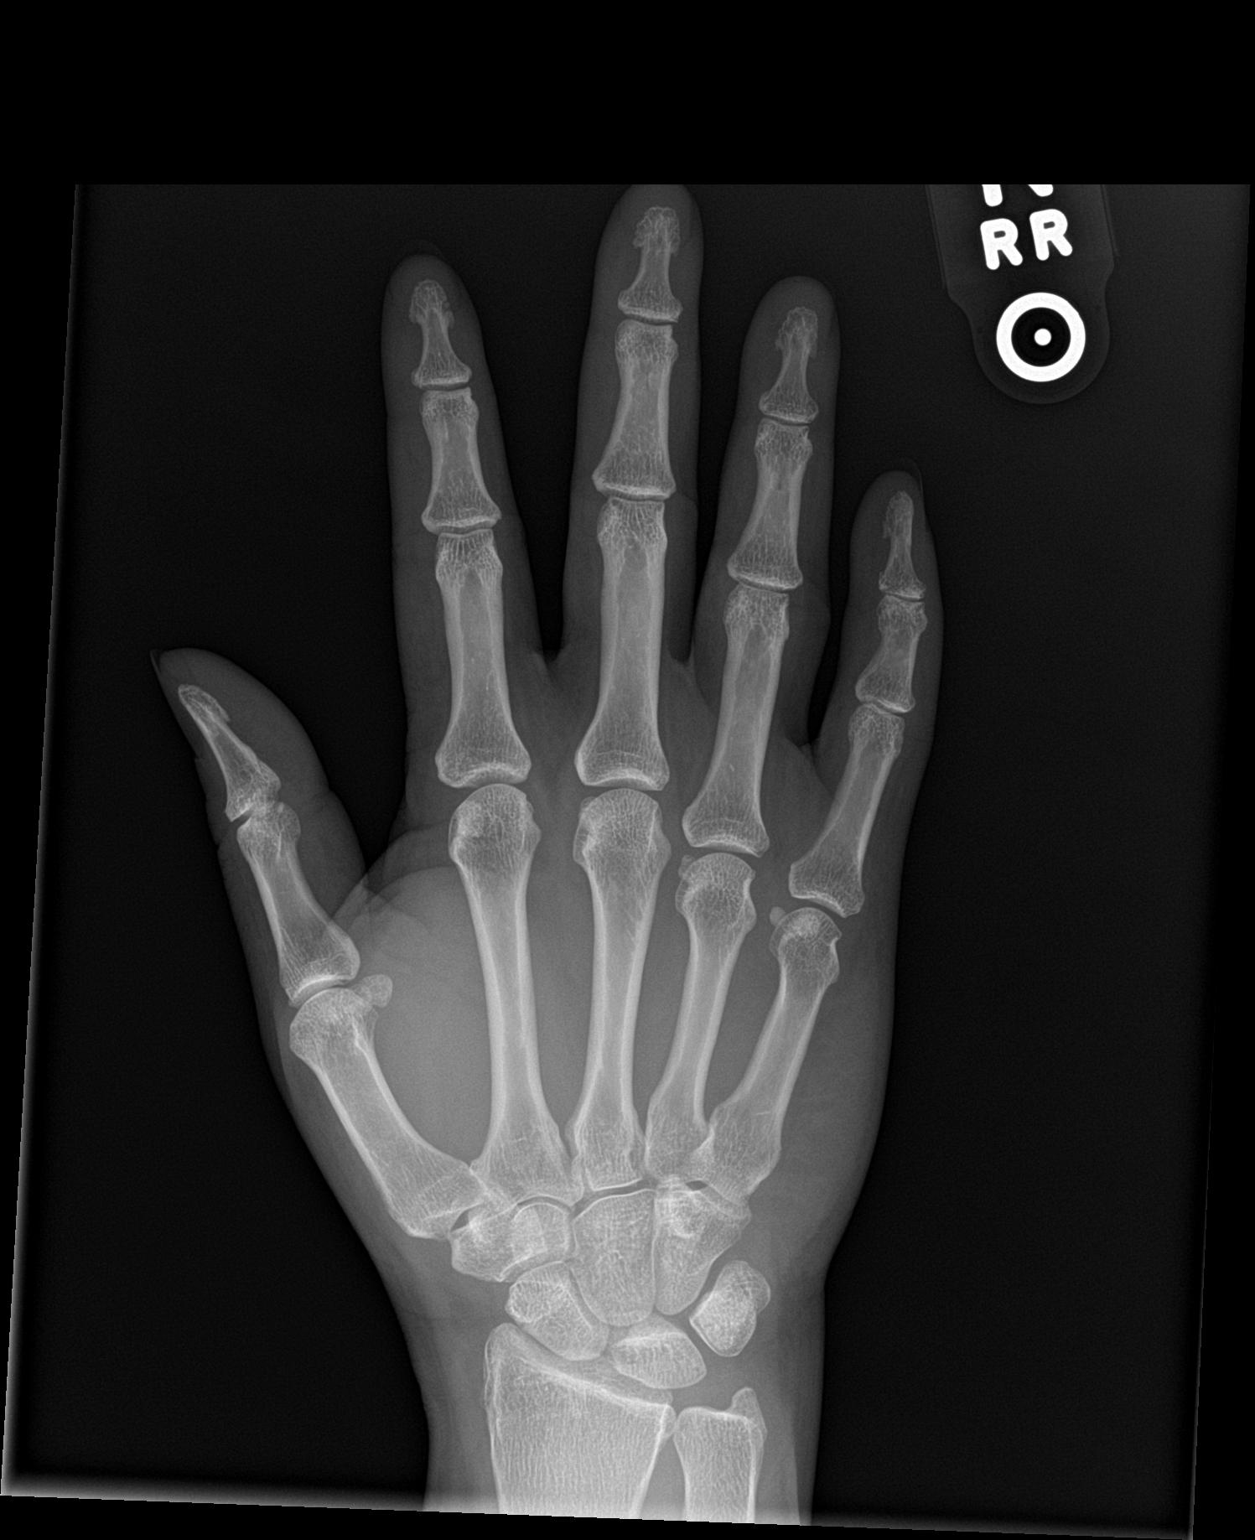

[hand obl]
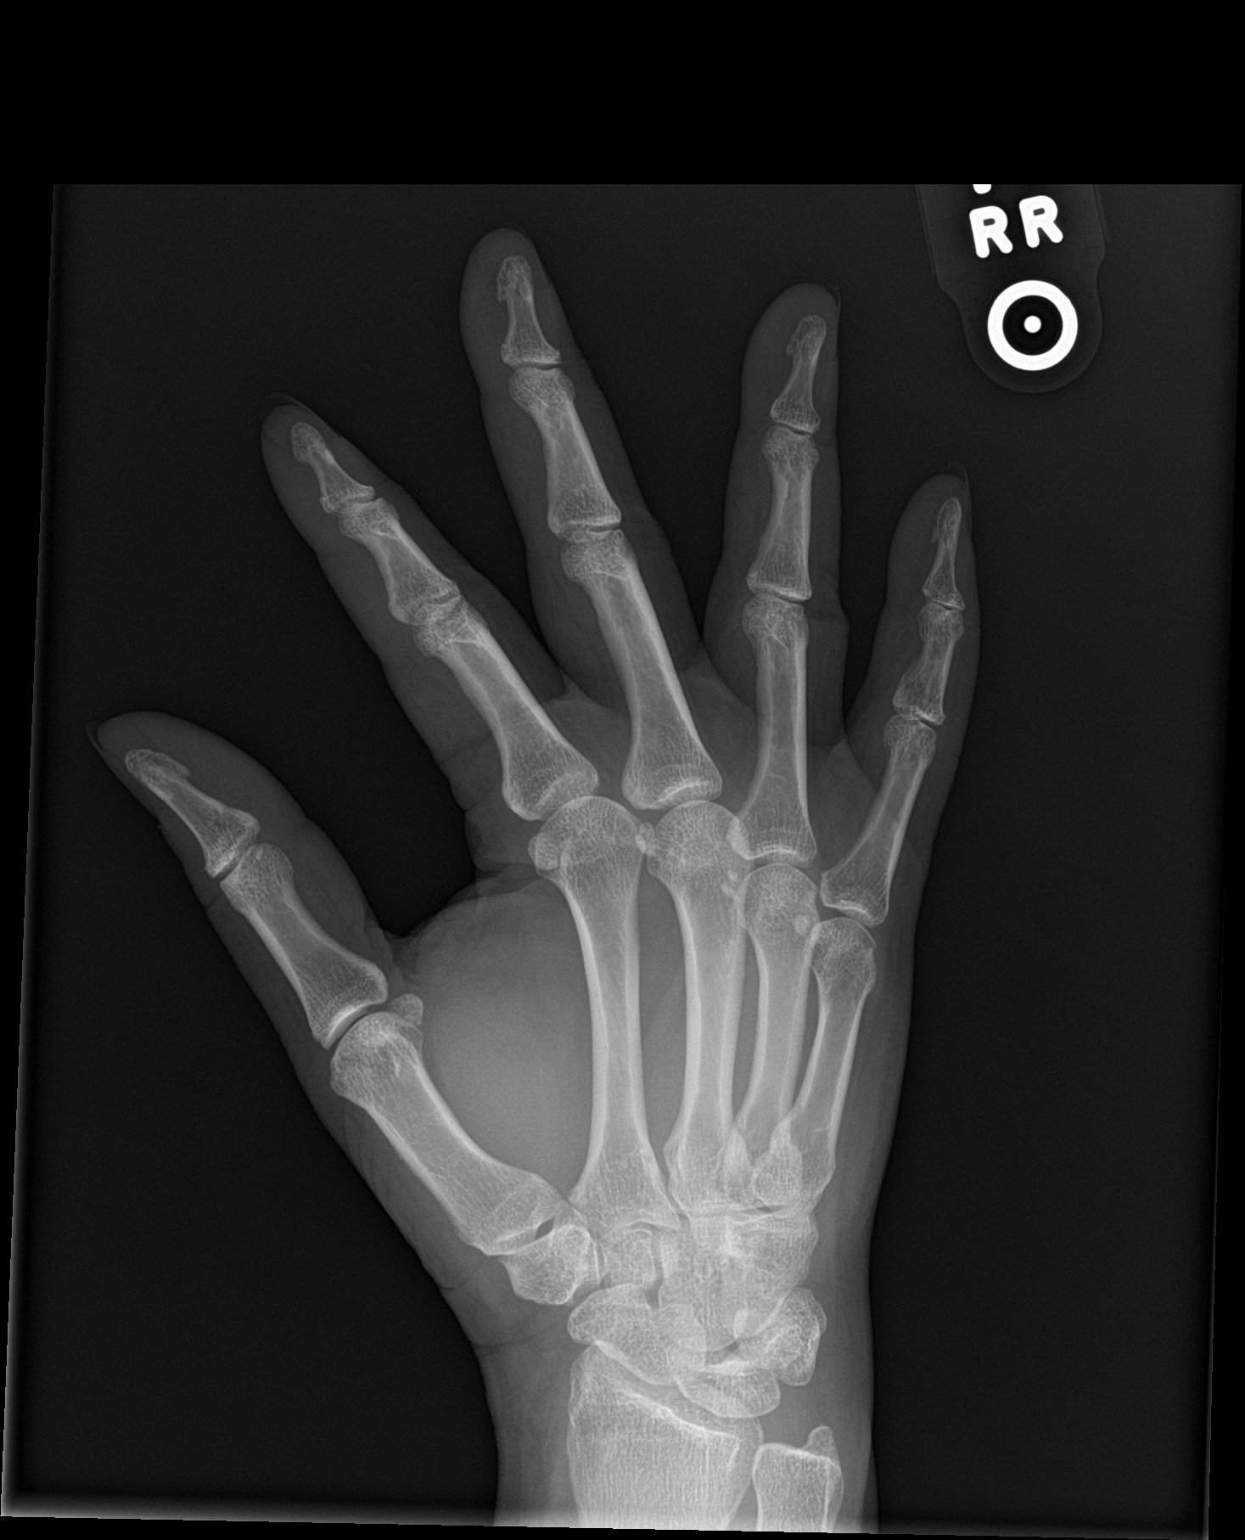

[hand lat]
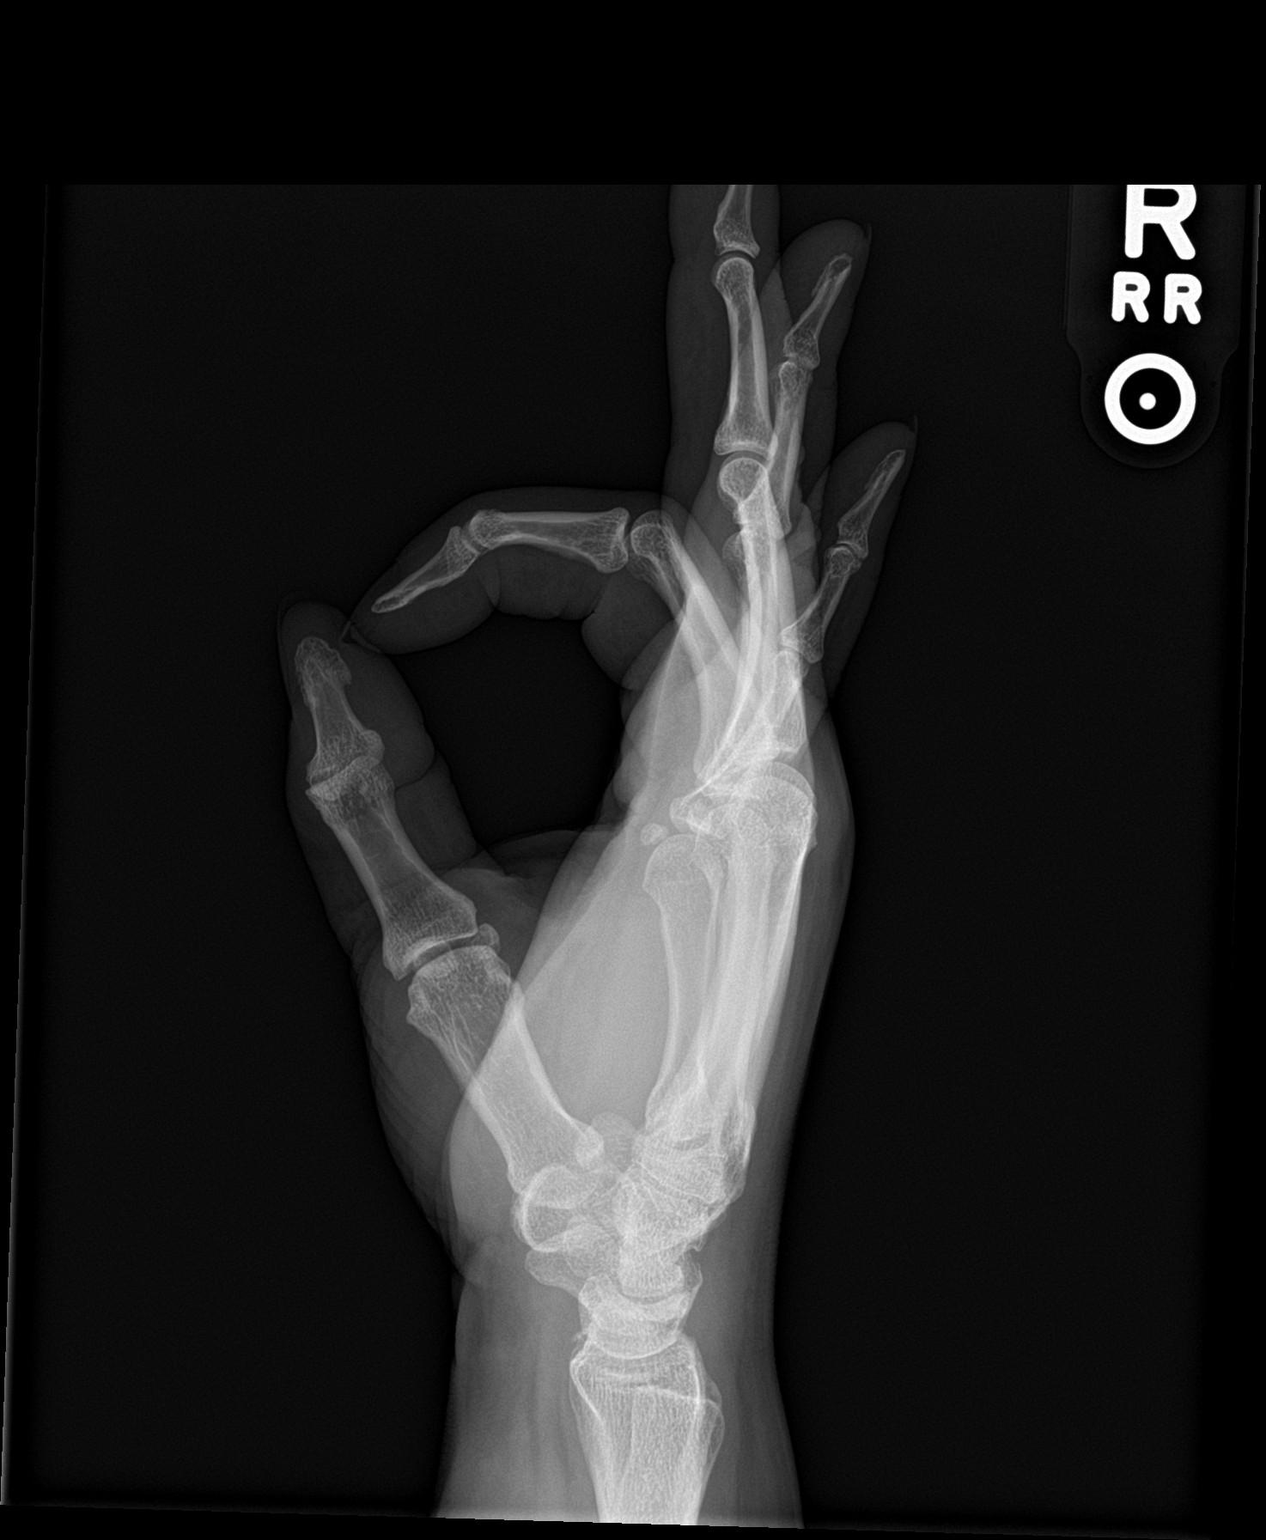

[3 of 3 positions shown; findings below may reference images not displayed]

FINDINGS: There is no evidence of fracture or dislocation. There is mild
osteoarthritis of the fifth DIP joint. The soft tissues are normal.
IMPRESSION: No acute osseous injury of the right hand.

## 2019-03-18 NOTE — ED Provider Notes (Signed)
[facility]    CSN: [id number] Arrival date & time: 03/18/19  0807     History   Chief Complaint Chief Complaint  Patient presents with  . Hand Pain    HPI Wendy Frye is a 53 y.o. female.   HPI  Wendy Frye is a 53 y.o. female presenting to [facility] with c/o chronic Right hand pain and swelling. 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 She developed bilateral carpal tunnel. She was seen by Sports Medicine, Dr. Georgina Snell, and was given hand exercises as well as wrist splints. The pain in her Left hand has resolved but Right hand has persisted despite quitting her job about 2 months ago.  She has taken Aleve on occasion for the pain but last dose was 2 weeks ago. No new injury. She is Right hand dominant but denies any repetitive motions since quitting her job.    Past Medical History:  Diagnosis Date  . Hypertension   . Hypertension 05/31/2017    Patient Active Problem List   Diagnosis Date Noted  . Bilateral carpal tunnel syndrome 11/18/2018  . Arthritis of knee, left 11/12/2017  . Hypertension 05/31/2017  . Muscle twitch 05/31/2017  . History of vertigo 05/31/2017  . Leiomyoma of uterus, unspecified 03/09/2014  . Abnormal pelvic ultrasound 03/09/2014    Past Surgical History:  Procedure Laterality Date  . BREAST ENHANCEMENT SURGERY    . CARPAL TUNNEL RELEASE    . CESAREAN SECTION      OB History   No obstetric history on file.      Home Medications    Prior to Admission medications   Medication Sig Start Date End Date Taking? Authorizing Provider  hydrochlorothiazide (HYDRODIURIL) 25 MG tablet Take 1 tablet (25 mg total) by mouth daily. 06/14/17  Yes Emeterio Reeve, DO  norethindrone (JENCYCLA) 0.35 MG tablet Take 1 tablet by mouth daily.   Yes [provider]    Family History Family History  Problem Relation Age of Onset  . Hypertension Mother   . Diabetes Mother   . Hyperlipidemia Mother   .  Alcohol abuse Father   . Diabetes Maternal Grandmother   . Hypertension Maternal Grandmother     Social History Social History   Tobacco Use  . Smoking status: Never Smoker  . Smokeless tobacco: Never Used  Substance Use Topics  . Alcohol use: No  . Drug use: No     Allergies   Asa [aspirin] and Motrin [ibuprofen]   Review of Systems Review of Systems  Musculoskeletal: Positive for arthralgias, joint swelling and myalgias.  Skin: Negative for color change and wound.  Neurological: Positive for numbness. Negative for weakness.     Physical Exam Triage Vital Signs ED Triage Vitals  Enc Vitals Group     BP 03/18/19 0833 (!) 173/85     Pulse Rate 03/18/19 0833 74     Resp 03/18/19 0833 18     Temp 03/18/19 0833 99.2 F (37.3 C)     Temp Source 03/18/19 0833 Oral     SpO2 03/18/19 0833 97 %     Weight 03/18/19 0834 201 lb 4 oz (91.3 kg)     Height 03/18/19 0834 5\' 3"  (1.6 m)     Head Circumference --      Peak Flow --      Pain Score 03/18/19 0833 8     Pain Loc --      Pain Edu? --  Excl. in GC? --    No data found.  Updated Vital Signs BP (!) 173/85 (BP Location: Right Arm)   Pulse 74   Temp 99.2 F (37.3 C) (Oral)   Resp 18   Ht 5\' 3"  (1.6 m)   Wt 201 lb 4 oz (91.3 kg)   SpO2 97%   BMI 35.65 kg/m   Visual Acuity Right Eye Distance:   Left Eye Distance:   Bilateral Distance:    Right Eye Near:   Left Eye Near:    Bilateral Near:     Physical Exam Vitals signs and nursing note reviewed.  Constitutional:      Appearance: She is well-developed.  HENT:     Head: Normocephalic and atraumatic.  Neck:     Musculoskeletal: Normal range of motion.  Cardiovascular:     Rate and Rhythm: Normal rate and regular rhythm.     Pulses:          Radial pulses are 2+ on the right side.  Pulmonary:     Effort: Pulmonary effort is normal.  Musculoskeletal: Normal range of motion.        General: Swelling and tenderness present.     Comments: Right  wrist: full ROM, non-tender.  Right hand: mild edema and tenderness over 5th metacarpal. No crepitus. 4/5 grip strength compared to Left hand.   Skin:    General: Skin is warm and dry.     Capillary Refill: Capillary refill takes less than 2 seconds.     Findings: No abrasion, bruising, erythema or rash.  Neurological:     Mental Status: She is alert and oriented to person, place, and time.  Psychiatric:        Behavior: Behavior normal.      UC Treatments / Results  Labs (all labs ordered are listed, but only abnormal results are displayed) Labs Reviewed - No data to display  EKG None  Radiology Dg Hand Complete Right  Result Date: 03/18/2019 CLINICAL DATA:  Right hand pain for several months EXAM: RIGHT HAND - COMPLETE 3+ VIEW COMPARISON:  None. FINDINGS: There is no evidence of fracture or dislocation. There is mild osteoarthritis of the fifth DIP joint. The soft tissues are normal. IMPRESSION: No acute osseous injury of the right hand. Electronically Signed   By: Kathreen Devoid   On: 03/18/2019 09:02    Procedures Procedures (including critical care time)  Medications Ordered in UC Medications - No data to display  Initial Impression / Assessment and Plan / UC Course  I have reviewed the triage vital signs and the nursing notes.  Pertinent labs & imaging results that were available during my care of the patient were reviewed by me and considered in my medical decision making (see chart for details).     Hx and exam c/w mild OA in Right hand AVS packet provided  Final Clinical Impressions(s) / UC Diagnoses   Final diagnoses:  Right hand pain  Osteoarthritis of finger of right hand     Discharge Instructions      You may take 500mg  acetaminophen every 4-6 hours or in combination with ibuprofen 400-600mg  every 6-8 hours as needed for pain and inflammation.    ED Prescriptions    None     Controlled Substance Prescriptions [place] Controlled Substance Registry  consulted? Not Applicable   Tyrell Antonio 03/18/19 1234

## 2019-03-18 NOTE — ED Triage Notes (Signed)
Pt comes in today with a c/o right hand pain. Pt states that she noticed this several months ago when she had numbness in her hands. Pt states that since leaving her job her left hand has significantly gotten better but her right hand is still swollen and cannot make a full fist. Pt states that she was seen by ortho and given exercises and hand braces with some relief. Pt states that she has been using ice/heat and hand creams with some relief and now states that it is no longer working for relief. Pt states that she does not like to take medications but did take an Aleve 2 weeks ago.

## 2019-03-18 NOTE — Discharge Instructions (Signed)
°  You may take 500mg acetaminophen every 4-6 hours or in combination with ibuprofen 400-600mg every 6-8 hours as needed for pain and inflammation.  

## 2019-06-19 ENCOUNTER — Ambulatory Visit (INDEPENDENT_AMBULATORY_CARE_PROVIDER_SITE_OTHER): Payer: Self-pay | Admitting: Sports Medicine

## 2019-06-19 ENCOUNTER — Encounter: Payer: Self-pay | Admitting: Sports Medicine

## 2019-06-19 DIAGNOSIS — G5603 Carpal tunnel syndrome, bilateral upper limbs: Secondary | ICD-10-CM

## 2019-06-19 MED ORDER — DIAZEPAM 5 MG PO TABS: ORAL_TABLET | ORAL | 0 refills | Status: DC

## 2019-06-19 NOTE — Progress Notes (Signed)
Subjective:    CC: bilateral hand pain  HPI: Patient has had history of bilateral hand pain/numbness/tingling and previously diagnosed carpal tunnel syndrome for which she had tried bilateral night splints without any relief. She was working a job requiring small, repetitive hand motions (fixing airline seats) but she has had to stop since the pain has gotten so severe. She states that her pain is the worst in her right pinky but experiences the numbness/tingling bilaterally in her palms and 3rd/4th phalanges. She denies taking any medications aside from an occasional Aleve. She went to Urgent Care for this in the past and had x-rays which only showed some arthritis in the 5th metatarsal.  She denies pain radiating up the arms or any neck pain.    I reviewed the past medical history, family history, social history, surgical history, and allergies today and no changes were needed.  Please see the problem list section below in epic for further details.  Past Medical History: Past Medical History:  Diagnosis Date  . Hypertension   . Hypertension 05/31/2017   Past Surgical History: Past Surgical History:  Procedure Laterality Date  . BREAST ENHANCEMENT SURGERY    . CARPAL TUNNEL RELEASE    . CESAREAN SECTION     Social History: Social History   Socioeconomic History  . Marital status: Married    Spouse name: Not on file  . Number of children: Not on file  . Years of education: Not on file  . Highest education level: Not on file  Occupational History  . Not on file  Social Needs  . Financial resource strain: Not on file  . Food insecurity    Worry: Not on file    Inability: Not on file  . Transportation needs    Medical: Not on file    Non-medical: Not on file  Tobacco Use  . Smoking status: Never Smoker  . Smokeless tobacco: Never Used  Substance and Sexual Activity  . Alcohol use: No  . Drug use: No  . Sexual activity: Never  Lifestyle  . Physical activity    Days per  week: Not on file    Minutes per session: Not on file  . Stress: Not on file  Relationships  . Social Herbalist on phone: Not on file    Gets together: Not on file    Attends religious service: Not on file    Active member of club or organization: Not on file    Attends meetings of clubs or organizations: Not on file    Relationship status: Not on file  Other Topics Concern  . Not on file  Social History Narrative  . Not on file   Family History: Family History  Problem Relation Age of Onset  . Hypertension Mother   . Diabetes Mother   . Hyperlipidemia Mother   . Alcohol abuse Father   . Diabetes Maternal Grandmother   . Hypertension Maternal Grandmother    Allergies: Allergies  Allergen Reactions  . Asa [Aspirin]     Mild rash   . Motrin [Ibuprofen]     Rash to face   Medications: See med rec.  Review of Systems: No fevers, chills, night sweats, weight loss, chest pain, or shortness of breath.   Objective:    General: Well Developed, well nourished, and in no acute distress.  Neuro: Alert and oriented x3, extra-ocular muscles intact, sensation grossly intact.  HEENT: Normocephalic, atraumatic, pupils equal round reactive to light, neck  supple, no masses, no lymphadenopathy, thyroid nonpalpable.  Skin: Warm and dry, no rashes. Cardiac: Regular rate and rhythm, no murmurs rubs or gallops, no lower extremity edema.  Respiratory: Clear to auscultation bilaterally. Not using accessory muscles, speaking in full sentences. MSK: hands: positive Tinel's, Phalen's test caused tingling in 5th phallages (bilaterally). Full ROM/strength in hands/wrists bilaterally. 3+ biceps and brachioradialis reflexes bilaterally. Negative Hoffman sign bilaterally.   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 Patient was given Valium to take prior to  appt due to high anxiety about procedure.  If this fails, ulnar nerve injections will be given.   No problem-specific Assessment & Plan notes found for this encounter.   ___________________________________________ Gwen Her. Dianah Field, M.D., ABFM., CAQSM. Primary Care and Sports Medicine [facility]  Adjunct Professor of [place] [employer]

## 2019-06-19 NOTE — Assessment & Plan Note (Signed)
Symptoms are somewhat atypical for carpal tunnel syndrome, she does have some cubital tunnel symptoms as well. I doubt any radicular origin. She has failed night splints, she is very anxious today, so we are going to not do the procedure. Adding Valium and she will return next week on Monday for bilateral median nerve Hydro dissection. If persistent discomfort or any residual ulnar distribution symptoms we will then proceed with an ulnar nerve hydrodissection at the elbows. She knows to have a driver for her appointment coming up.

## 2019-06-23 ENCOUNTER — Ambulatory Visit: Payer: Self-pay | Admitting: Sports Medicine

## 2019-07-20 ENCOUNTER — Other Ambulatory Visit: Payer: Self-pay

## 2019-07-20 ENCOUNTER — Encounter: Payer: Self-pay | Admitting: Emergency Medicine

## 2019-07-20 ENCOUNTER — Emergency Department
Admission: EM | Admit: 2019-07-20 | Discharge: 2019-07-20 | Disposition: A | Discharge status: Home / Self Care | Payer: Self-pay | Source: Home / Self Care | Attending: Family Medicine | Admitting: Family Medicine

## 2019-07-20 DIAGNOSIS — R1032 Left lower quadrant pain: Secondary | ICD-10-CM

## 2019-07-20 LAB — POCT URINALYSIS DIP (MANUAL ENTRY)
Bilirubin, UA: NEGATIVE
Glucose, UA: NEGATIVE mg/dL
Ketones, POC UA: NEGATIVE mg/dL
Leukocytes, UA: NEGATIVE
Nitrite, UA: NEGATIVE
Protein Ur, POC: NEGATIVE mg/dL
Spec Grav, UA: 1.02 (ref 1.010–1.025)
Urobilinogen, UA: 0.2 E.U./dL
pH, UA: 5.5 (ref 5.0–8.0)

## 2019-07-20 MED ORDER — CIPROFLOXACIN HCL 500 MG PO TABS: ORAL_TABLET | ORAL | 0 refills | Status: DC

## 2019-07-20 MED ORDER — METRONIDAZOLE 500 MG PO TABS: ORAL_TABLET | ORAL | 0 refills | Status: DC

## 2019-07-20 MED ORDER — AMOXICILLIN-POT CLAVULANATE 875-125 MG PO TABS: ORAL_TABLET | ORAL | 0 refills | Status: DC

## 2019-07-20 NOTE — Discharge Instructions (Signed)
Begin clear liquids for about 24 hours, then may begin a BRAT diet (Bananas, Rice, Applesauce, Toast) when abdominal pain improved. Then gradually advance to a regular diet as tolerated.   If symptoms become significantly worse during the night or over the weekend, proceed to the local emergency room.  

## 2019-07-20 NOTE — ED Provider Notes (Addendum)
[facility]    CSN: [id number] Arrival date & time: 07/20/19  1116     History   Chief Complaint Chief Complaint  Patient presents with  . Abdominal Cramping    HPI Wendy Frye is a 53 y.o. female.   Patient developed abdominal bloating and cramping after eating.  The symptoms have persisted today.  She denies nausea/vomiting, fevers, chills, and sweats, fatigue, and changes in bowel movements (she had a normal bowel movement today). She denies urinary and respiratory symptoms.  She had an episode of acute diverticulitis in 2016, but never followed up with GI.  The history is provided by the patient.  Abdominal Cramping This is a recurrent problem. The current episode started yesterday. The problem occurs constantly. The problem has not changed since onset.Associated symptoms include abdominal pain. Pertinent negatives include no headaches. The symptoms are aggravated by eating. Nothing relieves the symptoms. She has tried nothing for the symptoms.    Past Medical History:  Diagnosis Date  . Hypertension   . Hypertension 05/31/2017    Patient Active Problem List   Diagnosis Date Noted  . Bilateral carpal tunnel syndrome 11/18/2018  . Arthritis of knee, left 11/12/2017  . Hypertension 05/31/2017  . Muscle twitch 05/31/2017  . History of vertigo 05/31/2017  . Leiomyoma of uterus, unspecified 03/09/2014  . Abnormal pelvic ultrasound 03/09/2014    Past Surgical History:  Procedure Laterality Date  . BREAST ENHANCEMENT SURGERY    . CARPAL TUNNEL RELEASE    . CESAREAN SECTION      OB History   No obstetric history on file.      Home Medications    Prior to Admission medications   Medication Sig Start Date End Date Taking? Authorizing Provider  amoxicillin-clavulanate (AUGMENTIN) 875-125 MG tablet Take one tab PO Q8hr 07/20/19   Kandra Nicolas, MD  diazepam (VALIUM) 5 MG tablet 1 tab 2 hours before procedure or imaging, take another tab 30 minutes to  1 hour before if not feeling relaxed 06/19/19   Silverio Decamp, MD  hydrochlorothiazide (HYDRODIURIL) 25 MG tablet Take 1 tablet (25 mg total) by mouth daily. 06/14/17   Emeterio Reeve, DO  norethindrone (JENCYCLA) 0.35 MG tablet Take 1 tablet by mouth daily.    [provider]    Family History Family History  Problem Relation Age of Onset  . Hypertension Mother   . Diabetes Mother   . Hyperlipidemia Mother   . Alcohol abuse Father   . Diabetes Maternal Grandmother   . Hypertension Maternal Grandmother     Social History Social History   Tobacco Use  . Smoking status: Never Smoker  . Smokeless tobacco: Never Used  Substance Use Topics  . Alcohol use: No  . Drug use: No     Allergies   Asa [aspirin], Ciprofloxacin, and Motrin [ibuprofen]   Review of Systems Review of Systems  Constitutional: Negative.   HENT: Negative.   Eyes: Negative.   Respiratory: Negative.   Cardiovascular: Negative.   Gastrointestinal: Positive for abdominal pain. Negative for anal bleeding, blood in stool, constipation, diarrhea, nausea and vomiting.  Genitourinary: Negative.   Musculoskeletal: Negative.   Skin: Negative.   Neurological: Negative for headaches.     Physical Exam Triage Vital Signs ED Triage Vitals  Enc Vitals Group     BP 07/20/19 1144 (!) 152/89     Pulse Rate 07/20/19 1144 96     Resp 07/20/19 1144 16     Temp 07/20/19 1144  98.9 F (37.2 C)     Temp Source 07/20/19 1144 Oral     SpO2 07/20/19 1144 98 %     Weight 07/20/19 1145 200 lb 9.9 oz (91 kg)     Height 07/20/19 1145 5\' 3"  (1.6 m)     Head Circumference --      Peak Flow --      Pain Score 07/20/19 1144 8     Pain Loc --      Pain Edu? --      Excl. in [place]? --    No data found.  Updated Vital Signs BP (!) 152/89 (BP Location: Right Arm)   Pulse 96   Temp 98.9 F (37.2 C) (Oral)   Resp 16   Ht 5\' 3"  (1.6 m)   Wt 91 kg   SpO2 98%   BMI 35.54 kg/m   Visual Acuity Right Eye  Distance:   Left Eye Distance:   Bilateral Distance:    Right Eye Near:   Left Eye Near:    Bilateral Near:     Physical Exam Nursing notes and Vital Signs reviewed. Appearance:  Patient appears stated age, and in no acute distress.    Eyes:  Pupils are equal, round, and reactive to light and accomodation.  Extraocular movement is intact.  Conjunctivae are not inflamed   Pharynx:  Normal; moist mucous membranes  Neck:  Supple.  No adenopathy Lungs:  Clear to auscultation.  Breath sounds are equal.  Moving air well. Heart:  Regular rate and rhythm without murmurs, rubs, or gallops.  Abdomen:  Mild diffuse tenderness left lower quadrant without masses or hepatosplenomegaly.  Bowel sounds are decreased.  No CVA or flank tenderness.  Extremities:  No edema.  Skin:  No rash present.     UC Treatments / Results  Labs (all labs ordered are listed, but only abnormal results are displayed) Labs Reviewed  POCT URINALYSIS DIP (MANUAL ENTRY) - Abnormal; Notable for the following components:      Result Value   Color, UA light yellow (*)    Clarity, UA cloudy (*)    Blood, UA moderate (*)    All other components within normal limits    EKG   Radiology No results found.  Procedures Procedures (including critical care time)  Medications Ordered in UC Medications - No data to display  Initial Impression / Assessment and Plan / UC Course  I have reviewed the triage vital signs and the nursing notes.  Pertinent labs & imaging results that were available during my care of the patient were reviewed by me and considered in my medical decision making (see chart for details).    Presumed diverticulitis.  Begin Augmentin. Followup with Family Doctor in about 8 days. Note hematuria on urinalysis; recommend repeat urinalysis on follow-up. Recommend follow-up by GI in several weeks after present symptoms resolved.   Final Clinical Impressions(s) / UC Diagnoses   Final diagnoses:  Left  lower quadrant abdominal pain     Discharge Instructions     Begin clear liquids for about 24 hours, then may begin a BRAT diet (Bananas, Rice, Applesauce, Toast) when abdominal pain improved. Then gradually advance to a regular diet as tolerated.   If symptoms become significantly worse during the night or over the weekend, proceed to the local emergency room.     ED Prescriptions    Medication Sig Dispense Auth. Provider   amoxicillin-clavulanate (AUGMENTIN) 875-125 MG tablet Take one tab PO Q8hr 21  tablet Kandra Nicolas, MD         Kandra Nicolas, MD 07/20/19 1305  Addendum: Pharmacist reports distant past history of possible penicillin allergy.  Discontinue Augmentin and begin Cipro and Flagyl.     Kandra Nicolas, MD 07/20/19 [phone]

## 2019-07-20 NOTE — ED Triage Notes (Signed)
Patient states yesterday she began having mid abdominal cramping and bloating; no nausea, vomiting or diarrhea; intermittent yesterday but more constant today. She had dx diverticulitis about 3 years ago that felt similar; was given antibiotic that cleared condition. She has not tried OTCs. She has not travelled past 4 weeks.

## 2019-07-22 ENCOUNTER — Telehealth: Payer: Self-pay

## 2019-07-22 LAB — URINE CULTURE
MICRO NUMBER:: 729420
SPECIMEN QUALITY:: ADEQUATE

## 2019-07-22 NOTE — Telephone Encounter (Signed)
Spoke with patient, stated that after taking medication had a horrible headache.  Spoke with Wendy Frye, and she said to just take the flagyl, and call back tomorrow and let us know how she is feeling.  Pt agreed and understood.

## 2020-10-11 ENCOUNTER — Emergency Department
Admission: EM | Admit: 2020-10-11 | Discharge: 2020-10-11 | Disposition: A | Discharge status: Home / Self Care | Payer: Self-pay | Source: Home / Self Care

## 2020-10-11 ENCOUNTER — Emergency Department (INDEPENDENT_AMBULATORY_CARE_PROVIDER_SITE_OTHER): Payer: Self-pay

## 2020-10-11 ENCOUNTER — Other Ambulatory Visit: Payer: Self-pay

## 2020-10-11 DIAGNOSIS — M25562 Pain in left knee: Secondary | ICD-10-CM

## 2020-10-11 DIAGNOSIS — M1712 Unilateral primary osteoarthritis, left knee: Secondary | ICD-10-CM

## 2020-10-11 DIAGNOSIS — M25462 Effusion, left knee: Secondary | ICD-10-CM

## 2020-10-11 DIAGNOSIS — R030 Elevated blood-pressure reading, without diagnosis of hypertension: Secondary | ICD-10-CM

## 2020-10-11 IMAGING — DX DG KNEE COMPLETE 4+V*L*
4 series · 4 of 4 positions shown · non-contrast
Comparison: [DATE]

CLINICAL DATA: Knee pain and soreness since working out on squat
machine.

EXAM:
LEFT KNEE - COMPLETE 4+ VIEW

[knee ap]
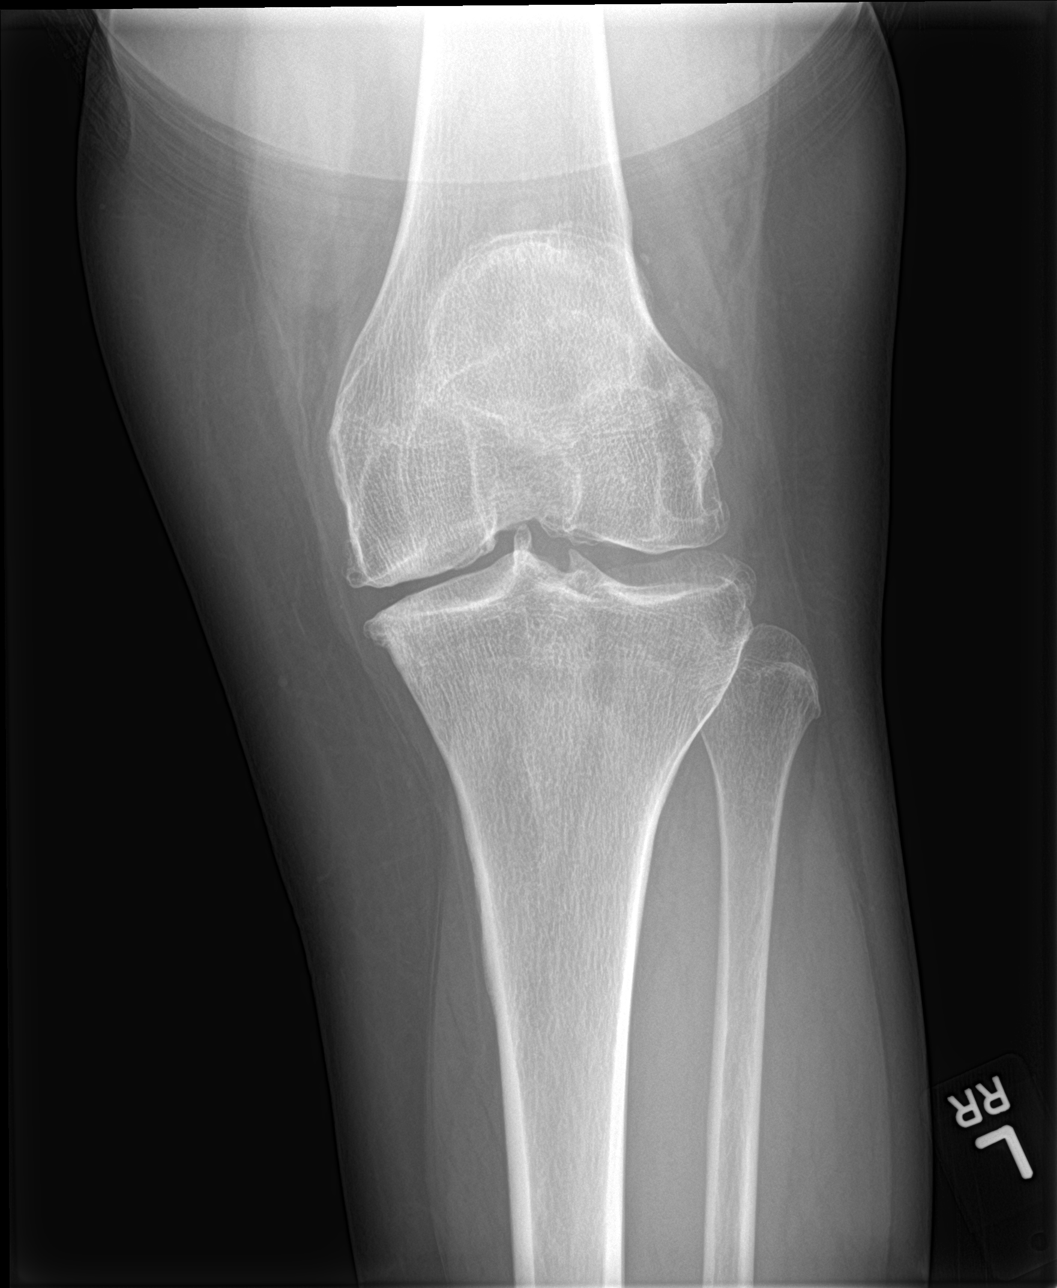

[knee lat]
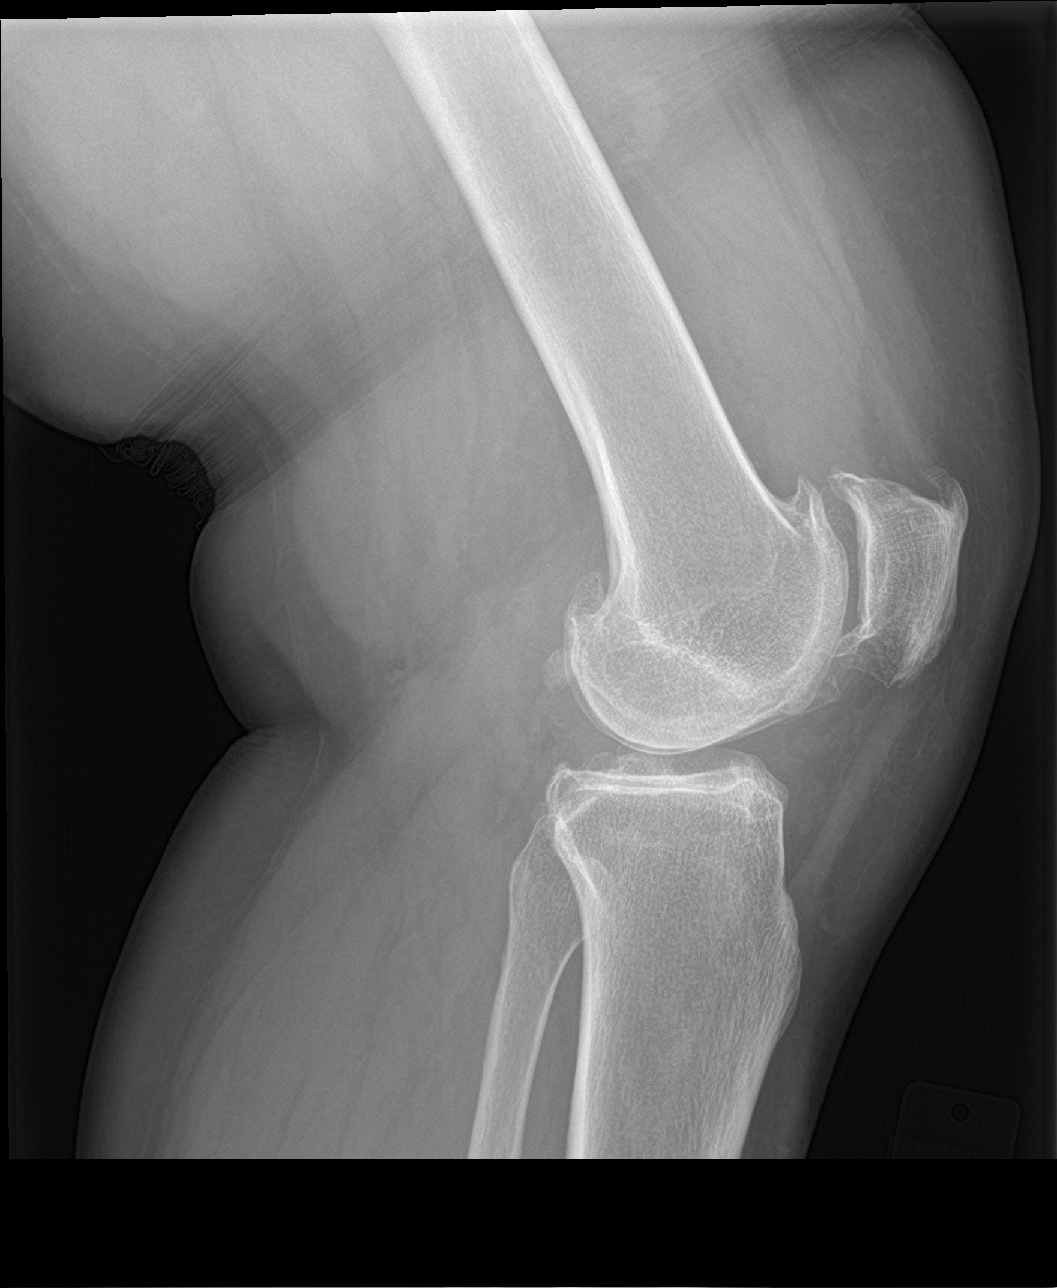

[knee obl (1 of 2)]
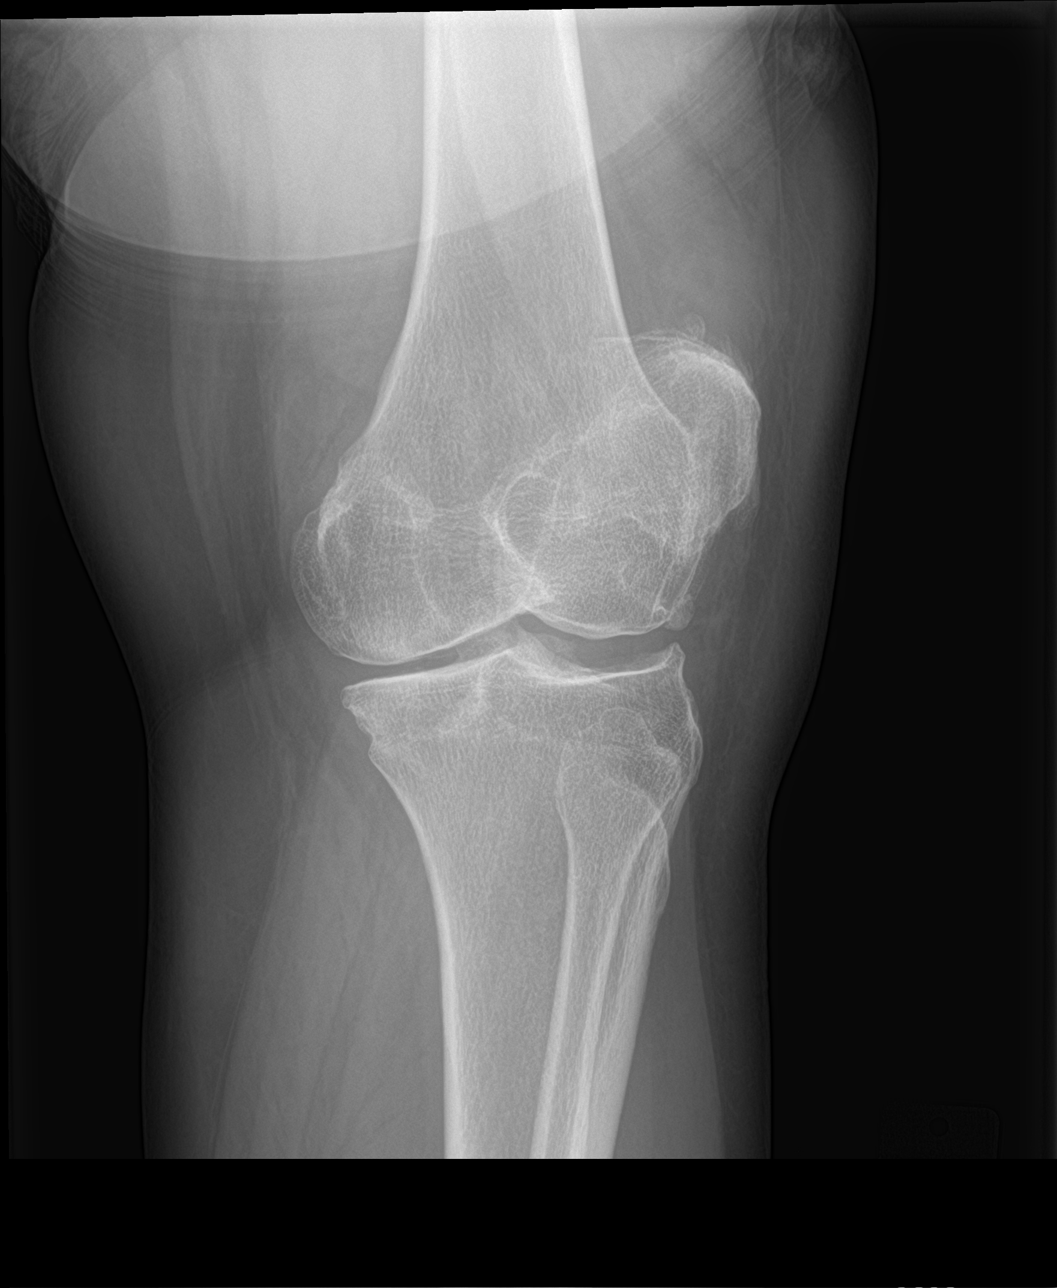

[knee obl (2 of 2)]
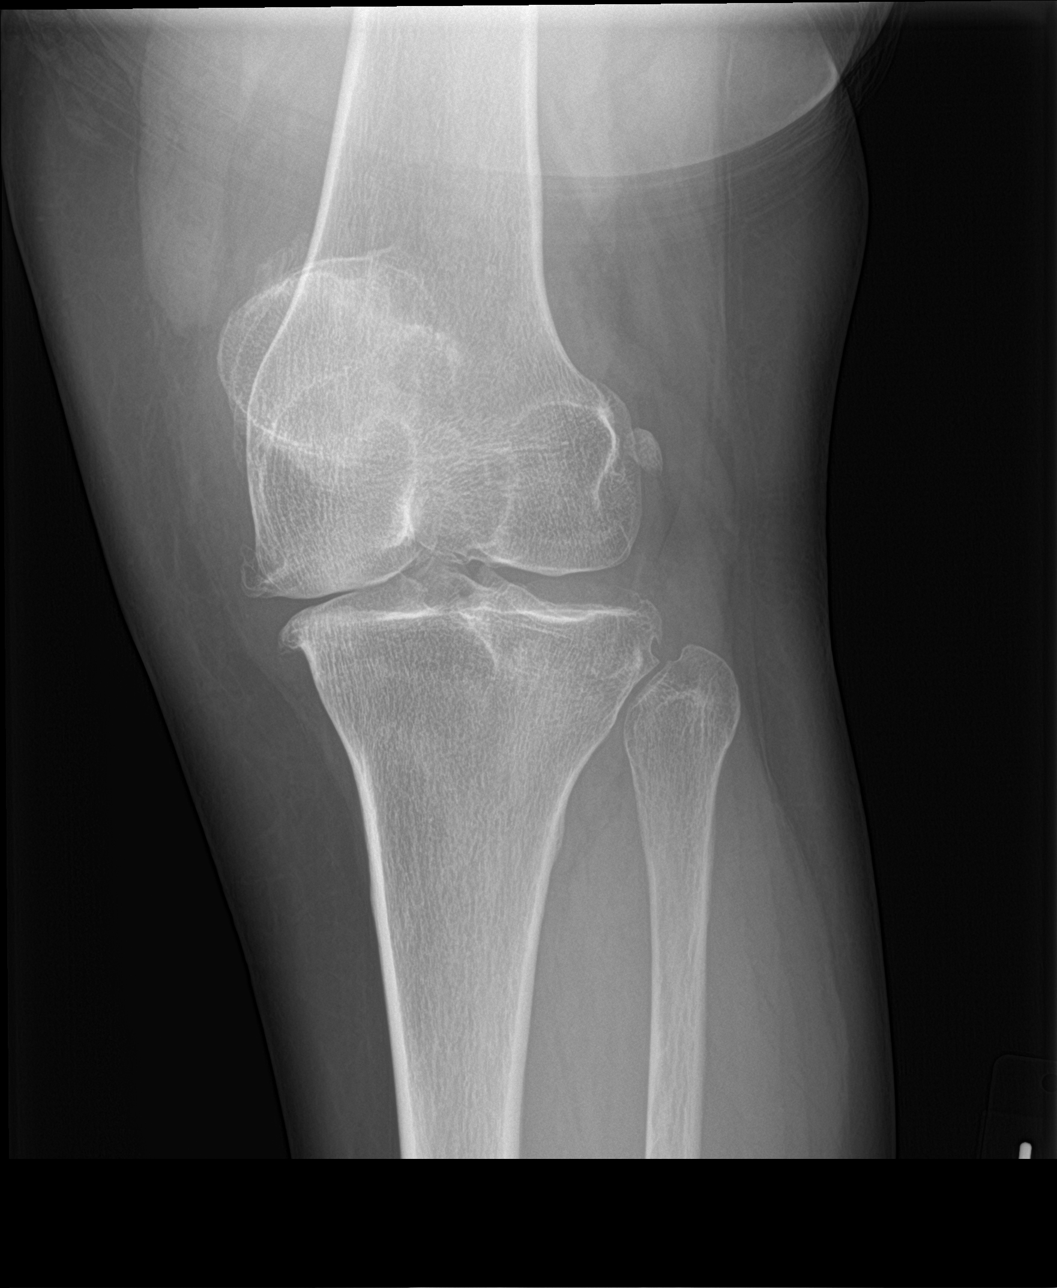

[4 of 4 positions shown; findings below may reference images not displayed]

FINDINGS: Suprapatellar joint effusion identified. There is moderate to severe
tricompartment osteoarthritis with joint space narrowing and
exuberant marginal spur formation. Sharpening of the tibial spines
identified.
IMPRESSION: Moderate to severe tricompartment osteoarthritis.

Suprapatellar joint effusion.

## 2020-10-11 MED ORDER — ACETAMINOPHEN 325 MG PO TABS
650.0000 mg | ORAL_TABLET | Freq: Once | ORAL | Status: AC
  Administered 2020-10-11: 650 mg via ORAL

## 2020-10-11 NOTE — Discharge Instructions (Addendum)
  You may use the knee brace for comfort. Elevate and apply a cool compress to your knee 2-3 times daily to help with pain and swelling.  Call to schedule a follow up appointment with Sports Medicine later this week or next week if not improving.   Be sure to take your blood pressure medication as prescribed. If your current medication causes unwanted side effects, urinary frequency, you can talk to your primary care provider to see if there is a different type of blood pressure medication that may be better for you.

## 2020-10-11 NOTE — ED Provider Notes (Signed)
[facility]    CSN: [id number] Arrival date & time: 10/11/20  5176      History   Chief Complaint Chief Complaint  Patient presents with  . Knee Pain    LT    HPI Wendy Frye is a 54 y.o. female.   HPI  Wendy Frye is a 54 y.o. female presenting to UC with c/o 1 week if gradually worsening Left knee pain, swelling and stiffness that started after using the squat machine at the gym. No specific injury.  She has tried rest and alternating ice and heat. Pain is 7/10, aching, worse with movement.    BP elevated in triage. Pt states she has had increased stress at work but also stressed about her knee. She notes her BP medication causing increased urinary frequency so she stopped taking it for a few days so she did not have to get up as often at night. Denies HA, dizziness, chest pain or SOB.   Past Medical History:  Diagnosis Date  . Hypertension   . Hypertension 05/31/2017    Patient Active Problem List   Diagnosis Date Noted  . Bilateral carpal tunnel syndrome 11/18/2018  . Arthritis of knee, left 11/12/2017  . Hypertension 05/31/2017  . Muscle twitch 05/31/2017  . History of vertigo 05/31/2017  . Leiomyoma of uterus, unspecified 03/09/2014  . Abnormal pelvic ultrasound 03/09/2014    Past Surgical History:  Procedure Laterality Date  . BREAST ENHANCEMENT SURGERY    . CARPAL TUNNEL RELEASE    . CESAREAN SECTION      OB History   No obstetric history on file.      Home Medications    Prior to Admission medications   Medication Sig Start Date End Date Taking? Authorizing Provider  ciprofloxacin (CIPRO) 500 MG tablet Take one tab PO Q12 hr 07/20/19   Kandra Nicolas, MD  diazepam (VALIUM) 5 MG tablet 1 tab 2 hours before procedure or imaging, take another tab 30 minutes to 1 hour before if not feeling relaxed 06/19/19   Silverio Decamp, MD  hydrochlorothiazide (HYDRODIURIL) 25 MG tablet Take 1 tablet (25 mg total) by mouth daily. 06/14/17    Emeterio Reeve, DO  metroNIDAZOLE (FLAGYL) 500 MG tablet Take one tab PO Q8hr 07/20/19   Kandra Nicolas, MD  norethindrone (JENCYCLA) 0.35 MG tablet Take 1 tablet by mouth daily.    [provider]    Family History Family History  Problem Relation Age of Onset  . Hypertension Mother   . Diabetes Mother   . Hyperlipidemia Mother   . Alcohol abuse Father   . Diabetes Maternal Grandmother   . Hypertension Maternal Grandmother     Social History Social History   Tobacco Use  . Smoking status: Never Smoker  . Smokeless tobacco: Never Used  Substance Use Topics  . Alcohol use: No  . Drug use: No     Allergies   Darvon [propoxyphene], Asa [aspirin], Ciprofloxacin, and Motrin [ibuprofen]   Review of Systems Review of Systems  Musculoskeletal: Positive for arthralgias and joint swelling. Negative for gait problem.  Skin: Negative for color change and wound.     Physical Exam Triage Vital Signs ED Triage Vitals  Enc Vitals Group     BP 10/11/20 0906 (!) 167/101     Pulse Rate 10/11/20 0906 73     Resp 10/11/20 0906 18     Temp 10/11/20 0906 99 F (37.2 C)     Temp Source 10/11/20  4944 Oral     SpO2 10/11/20 0906 99 %     Weight --      Height --      Head Circumference --      Peak Flow --      Pain Score 10/11/20 0903 7     Pain Loc --      Pain Edu? --      Excl. in [place]? --    No data found.  Updated Vital Signs BP (!) 167/101 (BP Location: Right Arm)   Pulse 73   Temp 99 F (37.2 C) (Oral)   Resp 18   LMP  (LMP Unknown)   SpO2 99%   Visual Acuity Right Eye Distance:   Left Eye Distance:   Bilateral Distance:    Right Eye Near:   Left Eye Near:    Bilateral Near:     Physical Exam Vitals and nursing note reviewed.  Constitutional:      Appearance: Normal appearance. She is well-developed.  HENT:     Head: Normocephalic and atraumatic.  Cardiovascular:     Rate and Rhythm: Normal rate.  Pulmonary:     Effort: Pulmonary  effort is normal.  Musculoskeletal:        General: Swelling and tenderness present. Normal range of motion.     Cervical back: Normal range of motion.     Comments: Left knee: mild edema. Diffuse tenderness. Full ROM without crepitus.  Calf is soft, non-tender.   Skin:    General: Skin is warm and dry.     Findings: No erythema.  Neurological:     Mental Status: She is alert and oriented to person, place, and time.  Psychiatric:        Behavior: Behavior normal.      UC Treatments / Results  Labs (all labs ordered are listed, but only abnormal results are displayed) Labs Reviewed - No data to display  EKG   Radiology DG Knee Complete 4 Views Left  Result Date: 10/11/2020 CLINICAL DATA:  Knee pain and soreness since working out on squat machine. EXAM: LEFT KNEE - COMPLETE 4+ VIEW COMPARISON:  11/12/2017 FINDINGS: Suprapatellar joint effusion identified. There is moderate to severe tricompartment osteoarthritis with joint space narrowing and exuberant marginal spur formation. Sharpening of the tibial spines identified. 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 Suprapatellar joint effusion. Electronically Signed   By: Kerby Moors M.D.   On: 10/11/2020 09:52    Procedures Procedures (including critical care time)  Medications Ordered in UC Medications  acetaminophen (TYLENOL) tablet 650 mg (650 mg Oral Given 10/11/20 0910)    Initial Impression / Assessment and Plan / UC Course  I have reviewed the triage vital signs and the nursing notes.  Pertinent labs & imaging results that were available during my care of the patient were reviewed by me and considered in my medical decision making (see chart for details).     Discussed imaging with pt No knee braces available today, encouraged pt to purchase a brace OTC for added comfort and support Call to schedule f/u with Sports Medicine in 1-2 weeks  Final Clinical Impressions(s) / UC Diagnoses   Final  diagnoses:  Acute pain of left knee  Tricompartment osteoarthritis of left knee  Effusion of left knee  Elevated blood pressure reading     Discharge Instructions      You may use the knee brace for comfort. Elevate and apply a cool compress to your knee 2-3 times  daily to help with pain and swelling.  Call to schedule a follow up appointment with Sports Medicine later this week or next week if not improving.   Be sure to take your blood pressure medication as prescribed. If your current medication causes unwanted side effects, urinary frequency, you can talk to your primary care provider to see if there is a different type of blood pressure medication that may be better for you.     ED Prescriptions    None     PDMP not reviewed this encounter.   Noe Gens, PA-C 10/11/20 1057

## 2020-10-11 NOTE — ED Triage Notes (Signed)
Pt c/o LT knee pain since Tues when she left the gym sore. Worsening over last several day. Heat/ice applied. Pain 7/10

## 2021-05-11 ENCOUNTER — Encounter: Payer: Self-pay | Admitting: Emergency Medicine

## 2021-05-11 ENCOUNTER — Emergency Department
Admission: EM | Admit: 2021-05-11 | Discharge: 2021-05-11 | Disposition: A | Discharge status: Home / Self Care | Payer: Self-pay | Source: Home / Self Care | Attending: Internal Medicine | Admitting: Internal Medicine

## 2021-05-11 ENCOUNTER — Other Ambulatory Visit: Payer: Self-pay

## 2021-05-11 DIAGNOSIS — K529 Noninfective gastroenteritis and colitis, unspecified: Secondary | ICD-10-CM

## 2021-05-11 MED ORDER — CIPROFLOXACIN HCL 500 MG PO TABS: ORAL_TABLET | ORAL | 0 refills | Status: DC

## 2021-05-11 MED ORDER — METRONIDAZOLE 500 MG PO TABS: ORAL_TABLET | ORAL | 0 refills | Status: DC

## 2021-05-11 NOTE — ED Triage Notes (Signed)
Ate sushi on Friday - has had stomach cramps since then - feels bloated  Denies nausea or diarrhea OTC  - peppermint tea ginger tea & tylenol Normal formed BM today  Covid vaccine x 3

## 2021-05-11 NOTE — ED Provider Notes (Addendum)
[facility]    CSN: [id number] Arrival date & time: 05/11/21  1639      History   Chief Complaint Chief Complaint  Patient presents with  . Abdominal Cramping    HPI Wendy Frye is a 55 y.o. female with history of diverticulitis comes to urgent care with left lower quadrant abdominal pain which started about 5 days ago.  Patient ate sushi prior to onset of the abdominal pain.  Abdominal pain is mild to moderate in severity.  It is associated with bloating and stomach cramps.  No nausea or vomiting.  No diarrhea.  No constipation.  Last bowel movement was this morning and it was normal.  No fever or chills.  Patient has a history of diverticulitis.  She has tried some peppermint tea ginger tea with no improvement in her symptoms.  HPI  Past Medical History:  Diagnosis Date  . Hypertension   . Hypertension 05/31/2017    Patient Active Problem List   Diagnosis Date Noted  . Bilateral carpal tunnel syndrome 11/18/2018  . Arthritis of knee, left 11/12/2017  . Hypertension 05/31/2017  . Muscle twitch 05/31/2017  . History of vertigo 05/31/2017  . Leiomyoma of uterus, unspecified 03/09/2014  . Abnormal pelvic ultrasound 03/09/2014    Past Surgical History:  Procedure Laterality Date  . BREAST ENHANCEMENT SURGERY    . CARPAL TUNNEL RELEASE    . CESAREAN SECTION      OB History   No obstetric history on file.      Home Medications    Prior to Admission medications   Medication Sig Start Date End Date Taking? Authorizing Provider  norethindrone (MICRONOR) 0.35 MG tablet Take 1 tablet by mouth daily.   Yes [provider]  ciprofloxacin (CIPRO) 500 MG tablet Take one tab PO Q12 hr 05/11/21   Zamyah Wiesman, Myrene Galas, MD  diazepam (VALIUM) 5 MG tablet 1 tab 2 hours before procedure or imaging, take another tab 30 minutes to 1 hour before if not feeling relaxed 06/19/19   Silverio Decamp, MD  metroNIDAZOLE (FLAGYL) 500 MG tablet Take one tab PO Q8hr  05/11/21   Eletha Culbertson, Myrene Galas, MD  hydrochlorothiazide (HYDRODIURIL) 25 MG tablet Take 1 tablet (25 mg total) by mouth daily. Patient not taking: Reported on 05/11/2021 06/14/17 05/11/21  Emeterio Reeve, DO    Family History Family History  Problem Relation Age of Onset  . Hypertension Mother   . Diabetes Mother   . Hyperlipidemia Mother   . Alcohol abuse Father   . Diabetes Maternal Grandmother   . Hypertension Maternal Grandmother     Social History Social History   Tobacco Use  . Smoking status: Never Smoker  . Smokeless tobacco: Never Used  Substance Use Topics  . Alcohol use: No  . Drug use: No     Allergies   Darvon [propoxyphene], Asa [aspirin], Ciprofloxacin, and Motrin [ibuprofen]   Review of Systems Review of Systems  Constitutional: Negative for chills and fever.  HENT: Negative.   Respiratory: Negative.   Gastrointestinal: Positive for abdominal pain. Negative for abdominal distention, constipation, diarrhea, nausea, rectal pain and vomiting.  Neurological: Negative.      Physical Exam Triage Vital Signs ED Triage Vitals  Enc Vitals Group     BP 05/11/21 1728 132/88     Pulse Rate 05/11/21 1728 75     Resp 05/11/21 1728 16     Temp 05/11/21 1728 99.2 F (37.3 C)     Temp Source 05/11/21  1728 Oral     SpO2 05/11/21 1728 100 %     Weight 05/11/21 1731 188 lb (85.3 kg)     Height 05/11/21 1731 5\' 3"  (1.6 m)     Head Circumference --      Peak Flow --      Pain Score 05/11/21 1731 5     Pain Loc --      Pain Edu? --      Excl. in [place]? --    No data found.  Updated Vital Signs BP 132/88 (BP Location: Right Arm) Comment: manual per pt request  Pulse 75   Temp 99.2 F (37.3 C) (Oral)   Resp 16   Ht 5\' 3"  (1.6 m)   Wt 85.3 kg   SpO2 100%   BMI 33.30 kg/m   Visual Acuity Right Eye Distance:   Left Eye Distance:   Bilateral Distance:    Right Eye Near:   Left Eye Near:    Bilateral Near:     Physical Exam Vitals and nursing note  reviewed.  Constitutional:      General: She is not in acute distress.    Appearance: She is not ill-appearing.  Cardiovascular:     Rate and Rhythm: Normal rate and regular rhythm.     Pulses: Normal pulses.     Heart sounds: Normal heart sounds.  Pulmonary:     Effort: Pulmonary effort is normal.     Breath sounds: Normal breath sounds.  Abdominal:     General: Bowel sounds are normal.     Comments: Left lower quadrant tenderness.  Bowel sounds normal.  No guarding or rebound tenderness.  Musculoskeletal:        General: Normal range of motion.  Neurological:     Mental Status: She is alert.      UC Treatments / Results  Labs (all labs ordered are listed, but only abnormal results are displayed) Labs Reviewed - No data to display  EKG   Radiology No results found.  Procedures Procedures (including critical care time)  Medications Ordered in UC Medications - No data to display  Initial Impression / Assessment and Plan / UC Course  I have reviewed the triage vital signs and the nursing notes.  Pertinent labs & imaging results that were available during my care of the patient were reviewed by me and considered in my medical decision making (see chart for details).    1.  Acute colitis versus diverticulitis: Ciprofloxacin 500 mg twice daily for 7 days Flagyl 500 mg every 8 hours for 7 days Increase oral fluid intake Tylenol or Motrin as needed for pain If you develop worsening abdominal pain, abdominal distention, persistent vomiting or nausea please return to urgent care to be evaluated. Final Clinical Impressions(s) / UC Diagnoses   Final diagnoses:  Acute colitis   Discharge Instructions   None    ED Prescriptions    Medication Sig Dispense Auth. Provider   metroNIDAZOLE (FLAGYL) 500 MG tablet Take one tab PO Q8hr 21 tablet Leshaun Biebel, Myrene Galas, MD   ciprofloxacin (CIPRO) 500 MG tablet Take one tab PO Q12 hr 14 tablet Olanda Boughner, Myrene Galas, MD     PDMP not  reviewed this encounter.   Chase Picket, MD 05/11/21 Dicky Doe, MD 05/11/21 [phone]

## 2021-07-28 ENCOUNTER — Encounter: Payer: Self-pay | Admitting: Osteopathic Medicine

## 2021-07-28 ENCOUNTER — Ambulatory Visit (INDEPENDENT_AMBULATORY_CARE_PROVIDER_SITE_OTHER): Payer: Self-pay | Admitting: Osteopathic Medicine

## 2021-07-28 VITALS — BP 147/82 | HR 80 | Temp 98.7°F | Wt 189.0 lb

## 2021-07-28 DIAGNOSIS — M79604 Pain in right leg: Secondary | ICD-10-CM

## 2021-07-28 DIAGNOSIS — G5701 Lesion of sciatic nerve, right lower limb: Secondary | ICD-10-CM

## 2021-07-28 MED ORDER — CYCLOBENZAPRINE HCL 10 MG PO TABS: 5.0000 mg | ORAL_TABLET | Freq: Three times a day (TID) | ORAL | 1 refills | Status: DC | PRN

## 2021-07-28 MED ORDER — PREDNISONE 20 MG PO TABS: 20.0000 mg | ORAL_TABLET | Freq: Two times a day (BID) | ORAL | 0 refills | Status: AC

## 2021-07-28 MED ORDER — ACETAMINOPHEN 500 MG PO TABS: 1000.0000 mg | ORAL_TABLET | Freq: Four times a day (QID) | ORAL | 0 refills | Status: DC | PRN

## 2021-07-28 NOTE — Patient Instructions (Addendum)
Plan: Prescriptions: Steroids (predisone) burst for 5 days Tylenol (acetaminophen) for pain since you get rash w/ NSAID's Muscle relaxer (cyclobenzaprine) as needed for muscle spasm - may help to take in the evening / bedtime  Refer for formal physical therapy Please contact our office for sports medicine appointment with Dr T if PT not helping

## 2021-07-28 NOTE — Progress Notes (Signed)
Wendy Frye is a 55 y.o. female who presents to  [facility]  today, 07/28/21, seeking care for the following:  Leg pain in groin area on R side has been bothering her about 6 weeks now after starting work with more walking for her job, and wearing steel toe boots. Pain radiates into lower back and down leg.  On exam, positive logroll on right side, negative on left, positive pain with internal and external rotation of the hip, negative straight leg raise, pain with hip flexion on right but not on left, positive Trendelenburg to right     ASSESSMENT & PLAN with other pertinent findings:  The primary encounter diagnosis was Pain of right lower extremity. A diagnosis of Piriformis syndrome of right side was also pertinent to this visit.   Suspect piriformis syndrome, lower suspicion for hip osteoarthritis but this would be in the differential.  Will defer x-ray for now and treat as muscular issue, referral to physical therapy, medications as below, patient has rash reactions to several NSAIDs so we will stick with acetaminophen.  Patient Instructions  Plan: Prescriptions: Steroids (predisone) burst for 5 days Tylenol (acetaminophen) for pain since you get rash w/ NSAID's Muscle relaxer (cyclobenzaprine) as needed for muscle spasm - may help to take in the evening / bedtime  Refer for formal physical therapy Please contact our office for sports medicine appointment with Dr T if PT not helping      Orders Placed This Encounter  Procedures   Ambulatory referral to Physical Therapy    Meds ordered this encounter  Medications   cyclobenzaprine (FLEXERIL) 10 MG tablet    Sig: Take 0.5-1 tablets (5-10 mg total) by mouth 3 (three) times daily as needed for muscle spasms. Caution: can cause drowsiness    Dispense:  60 tablet    Refill:  1   acetaminophen (TYLENOL) 500 MG tablet    Sig: Take 2 tablets (1,000 mg total) by mouth  every 6 (six) hours as needed.    Dispense:  30 tablet    Refill:  0   predniSONE (DELTASONE) 20 MG tablet    Sig: Take 1 tablet (20 mg total) by mouth 2 (two) times daily with breakfast and lunch for 5 days.    Dispense:  10 tablet    Refill:  0     See below for relevant physical exam findings  See below for recent lab and imaging results reviewed  Medications, allergies, PMH, PSH, SocH, FamH reviewed below    Follow-up instructions: Return if symptoms worsen or fail to improve.                                        Exam:  BP (!) 147/82 (BP Location: Left Arm, Patient Position: Sitting, Cuff Size: Large)   Pulse 80   Temp 98.7 F (37.1 C) (Oral)   Wt 189 lb 0.6 oz (85.7 kg)   BMI 33.49 kg/m  Constitutional: VS see above. General Appearance: alert, well-developed, well-nourished, NAD Neck: No masses, trachea midline.  Respiratory: Normal respiratory effort. n Musculoskeletal: Gait normal. Symmetric and independent movement of all extremities Abdominal: non-tender, non-distended, Neurological: Normal balance/coordination. No tremor. Skin: warm, dry, intact.  Psychiatric: Normal judgment/insight. Normal mood and affect. Oriented x3.   Current Meds  Medication Sig   acetaminophen (TYLENOL) 500 MG tablet Take 2 tablets (  1,000 mg total) by mouth every 6 (six) hours as needed.   cyclobenzaprine (FLEXERIL) 10 MG tablet Take 0.5-1 tablets (5-10 mg total) by mouth 3 (three) times daily as needed for muscle spasms. Caution: can cause drowsiness   norethindrone (MICRONOR) 0.35 MG tablet Take 1 tablet by mouth daily.   predniSONE (DELTASONE) 20 MG tablet Take 1 tablet (20 mg total) by mouth 2 (two) times daily with breakfast and lunch for 5 days.    Allergies  Allergen Reactions   Darvon [Propoxyphene] Shortness Of Breath    Said she "OD'd"   Asa [Aspirin]     Mild rash    Ciprofloxacin Other (See Comments)    States her body ached all  over after last course of cipro   Motrin [Ibuprofen]     Rash to face   Other    Penicillins     Patient Active Problem List   Diagnosis Date Noted   Bilateral carpal tunnel syndrome 11/18/2018   Arthritis of knee, left 11/12/2017   Hypertension 05/31/2017   Muscle twitch 05/31/2017   History of vertigo 05/31/2017   Leiomyoma of uterus, unspecified 03/09/2014   Abnormal pelvic ultrasound 03/09/2014    Family History  Problem Relation Age of Onset   Hypertension Mother    Diabetes Mother    Hyperlipidemia Mother    Alcohol abuse Father    Diabetes Maternal Grandmother    Hypertension Maternal Grandmother     Social History   Tobacco Use  Smoking Status Never  Smokeless Tobacco Never    Past Surgical History:  Procedure Laterality Date   BREAST ENHANCEMENT SURGERY     CARPAL TUNNEL RELEASE     CESAREAN SECTION      Immunization History  Administered Date(s) Administered   PFIZER(Purple Top)SARS-COV-2 Vaccination 03/14/2020, 04/04/2020, 11/22/2020   Unspecified SARS-COV-2 Vaccination 03/14/2020, 04/04/2020, 11/22/2020   Varicella 12/18/1970    No results found for this or any previous visit (from the past 2160 hour(s)).  No results found.     All questions at time of visit were answered - patient instructed to contact office with any additional concerns or updates. ER/RTC precautions were reviewed with the patient as applicable.   Please note: manual typing as well as voice recognition software may have been used to produce this document - typos may escape review. Please contact Dr. Sheppard Coil for any needed clarifications.

## 2021-08-02 ENCOUNTER — Ambulatory Visit (INDEPENDENT_AMBULATORY_CARE_PROVIDER_SITE_OTHER): Payer: Self-pay | Admitting: Physical Therapy

## 2021-08-02 ENCOUNTER — Encounter: Payer: Self-pay | Admitting: Physical Therapy

## 2021-08-02 ENCOUNTER — Other Ambulatory Visit: Payer: Self-pay

## 2021-08-02 DIAGNOSIS — M25551 Pain in right hip: Secondary | ICD-10-CM

## 2021-08-02 DIAGNOSIS — M6281 Muscle weakness (generalized): Secondary | ICD-10-CM

## 2021-08-02 DIAGNOSIS — R29898 Other symptoms and signs involving the musculoskeletal system: Secondary | ICD-10-CM

## 2021-08-02 NOTE — Therapy (Addendum)
[facility] [address] [place], [place], [address] Phone: [phone]   Fax:  [phone]  Physical Therapy Evaluation and Discharge  Patient Details  Name: Wendy Frye MRN: [id number] Date of Birth: 01/06/1966 Referring Provider (PT): alexander, natalie   Encounter Date: 08/02/2021   PT End of Session - 08/02/21 0843     Visit Number 1    Number of Visits 6    Date for PT Re-Evaluation 09/13/21    PT Start Time 0801    PT Stop Time 0839    PT Time Calculation (min) 38 min    Activity Tolerance Patient tolerated treatment well    Behavior During Therapy WFL for tasks assessed/performed             Past Medical History:  Diagnosis Date   Hypertension    Hypertension 05/31/2017    Past Surgical History:  Procedure Laterality Date   BREAST ENHANCEMENT SURGERY     CARPAL TUNNEL RELEASE     CESAREAN SECTION      There were no vitals filed for this visit.    Subjective Assessment - 08/02/21 0804     Subjective Pt returned to her job about 2 months ago which requires standing for 10 hours in steel toed boots. She is having Rt groin and hip pain. Pain comes and goes with no specific activities causing it to worsen, pain decreases with meds.    Limitations Walking;Standing    Diagnostic tests none    Patient Stated Goals decrease pain and return to being active    Currently in Pain? Yes    Pain Score 3     Pain Location Hip    Pain Orientation Right    Pain Descriptors / Indicators Sore    Pain Type Chronic pain    Pain Onset More than a month ago    Pain Frequency Constant    Aggravating Factors  none reported    Pain Relieving Factors meds                [facility] PT Assessment - 08/02/21 0001       Assessment   Medical Diagnosis Rt piriformis syndrome    Referring Provider (PT) alexander, natalie      Balance Screen   Has the patient fallen in the past 6 months No      Prior Function    Level of Independence Independent    Vocation Requirements standing x 10 hours a day      Observation/Other Assessments   Focus on Therapeutic Outcomes (FOTO)  63 functional status measure      Functional Tests   Functional tests Squat      Squat   Comments pain in Rt anterior hip with quarter squat      ROM / Strength   AROM / PROM / Strength AROM;Strength      AROM   Overall AROM Comments Rt hip with decreased ER/IR limited by pain      Strength   Strength Assessment Site Hip    Right/Left Hip Right;Left    Right Hip Flexion 3+/5    Right Hip Extension 3/5    Right Hip ABduction 4-/5    Left Hip Flexion 4+/5    Left Hip Extension 4-/5    Left Hip ABduction 4+/5      Flexibility   Soft Tissue Assessment /Muscle Length yes    Quadriceps decreased Rt    Piriformis decreased Rt        Palpation   Palpation comment Rt piriformis TTP, no tenderness noted Rt hip joint or ITB      Special Tests    Special Tests Hip Special Tests    Hip Special Tests  Hip Scouring      Hip Scouring   Findings Positive    Side Right                        Objective measurements completed on examination: See above findings.       [facility] Adult PT Treatment/Exercise - 08/02/21 0001       Exercises   Exercises Knee/Hip      Knee/Hip Exercises: Stretches   Hip Flexor Stretch 30 seconds      Knee/Hip Exercises: Standing   Other Standing Knee Exercises sidestep with red TB x 10      Knee/Hip Exercises: Supine   Bridges 10 reps    Bridges with [employer] 10 reps      Knee/Hip Exercises: Sidelying   Clams 10 reps                    PT Education - 08/02/21 [id number]     Education Details PT POC and goals, HEP    Person(s) Educated Patient    Methods Explanation;Demonstration;Handout    Comprehension Returned demonstration;Verbalized understanding                 PT Long Term Goals - 08/02/21 0912       PT LONG TERM GOAL #1   Title Pt will  be independent with HEP    Time 6    Period Weeks    Status New    Target Date 09/13/21      PT LONG TERM GOAL #2   Title Pt will improve FOTO to >= 76 to demo improved functional mobility    Time 6    Period Weeks    Status New    Target Date 09/13/21      PT LONG TERM GOAL #3   Title Pt will improve Rt LE strength to 4+/5 to tolerate working full shift with decreased pain    Time 6    Period Weeks    Status New    Target Date 09/13/21      PT LONG TERM GOAL #4   Title Pt will tolerate working full shift with pain <= 2/10    Time 6    Period Weeks    Status New    Target Date 09/13/21                    Plan - 08/02/21 0843     Clinical Impression Statement Pt is a 55 y/o female referred for Rt piriformis syndrome and Rt hip pain. Pt presents with decreased strength, ROM, functional mobility and activity tolerance and will benefit from skilled PT to address deficits and improve functional abilities to return to work and leisure with decreased pain.    Personal Factors and Comorbidities Profession;Time since onset of injury/illness/exacerbation    Examination-Activity Limitations Stand;Squat    Examination-Participation Restrictions Occupation;Community Activity    Stability/Clinical Decision Making Stable/Uncomplicated    Clinical Decision Making Low    Rehab Potential Good    PT Frequency 1x / week    PT Duration 6 weeks    PT Treatment/Interventions Taping;Dry needling;Manual techniques;Patient/family education;Neuromuscular re-education;Balance training;Therapeutic exercise;Therapeutic activities;Electrical Stimulation;Aquatic Therapy;Iontophoresis 67m/ml Dexamethasone;Moist Heat;Cryotherapy;Gait training    PT  Next Visit Plan assess HEP, dry needling if appropriate    PT Home Exercise Plan [place] and Agree with Plan of Care Patient             Patient will benefit from skilled therapeutic intervention in order to improve the following  deficits and impairments:  Decreased activity tolerance, Decreased strength, Impaired flexibility, Pain, Decreased range of motion  Visit Diagnosis: Muscle weakness (generalized) - Plan: PT plan of care cert/re-cert  Pain in right hip - Plan: PT plan of care cert/re-cert  Other symptoms and signs involving the musculoskeletal system - Plan: PT plan of care cert/re-cert     Problem List Patient Active Problem List   Diagnosis Date Noted   Bilateral carpal tunnel syndrome 11/18/2018   Arthritis of knee, left 11/12/2017   Hypertension 05/31/2017   Muscle twitch 05/31/2017   History of vertigo 05/31/2017   Leiomyoma of uterus, unspecified 03/09/2014   Abnormal pelvic ultrasound 03/09/2014   PHYSICAL THERAPY DISCHARGE SUMMARY  Visits from Start of Care: 1  Current functional level related to goals / functional outcomes: None, eval only   Remaining deficits: See above   Education / Equipment: HEP   Patient agrees to discharge. Patient goals were not met. Patient is being discharged due to not returning since the last visit. Isabelle Course, PT,DPT09/28/222:52 PM  Amariyana Heacox, PT  Isabelle Course 08/02/2021, 9:27 AM  [facility] [place], [place], [address] Phone: [phone]   Fax:  [phone]  Name: Wendy Frye MRN: [id number] Date of Birth: [birth year]

## 2021-08-02 NOTE — Patient Instructions (Signed)
Access Code: [email or web address] URL: [email or web address] Date: 08/02/2021 Prepared by: Isabelle Course  Exercises Seated Hip Flexor Stretch - 1 x daily - 7 x weekly - 3 sets - 1 reps - 20-30 seconds hold Clamshell - 1 x daily - 7 x weekly - 3 sets - 10 reps Supine Bridge - 1 x daily - 7 x weekly - 3 sets - 10 reps Supine Bridge with Mini Swiss Ball Between Knees - 1 x daily - 7 x weekly - 3 sets - 10 reps Side Stepping with Resistance at Thighs - 1 x daily - 7 x weekly - 3 sets - 10 reps  Patient Education Trigger Point Dry Needling

## 2021-08-04 ENCOUNTER — Emergency Department
Admission: EM | Admit: 2021-08-04 | Discharge: 2021-08-04 | Disposition: A | Discharge status: Home / Self Care | Payer: Self-pay | Source: Home / Self Care

## 2021-08-04 ENCOUNTER — Emergency Department (INDEPENDENT_AMBULATORY_CARE_PROVIDER_SITE_OTHER): Payer: Self-pay

## 2021-08-04 DIAGNOSIS — M5416 Radiculopathy, lumbar region: Secondary | ICD-10-CM

## 2021-08-04 DIAGNOSIS — M5417 Radiculopathy, lumbosacral region: Secondary | ICD-10-CM

## 2021-08-04 DIAGNOSIS — S39012A Strain of muscle, fascia and tendon of lower back, initial encounter: Secondary | ICD-10-CM

## 2021-08-04 IMAGING — DX DG LUMBAR SPINE COMPLETE 4+V
5 series · 5 of 5 positions shown · non-contrast
Comparison: CT abdomen/pelvis [DATE]

CLINICAL DATA: Lumbar radiculopathy

EXAM:
LUMBAR SPINE - COMPLETE 4+ VIEW

[l-spine ap]
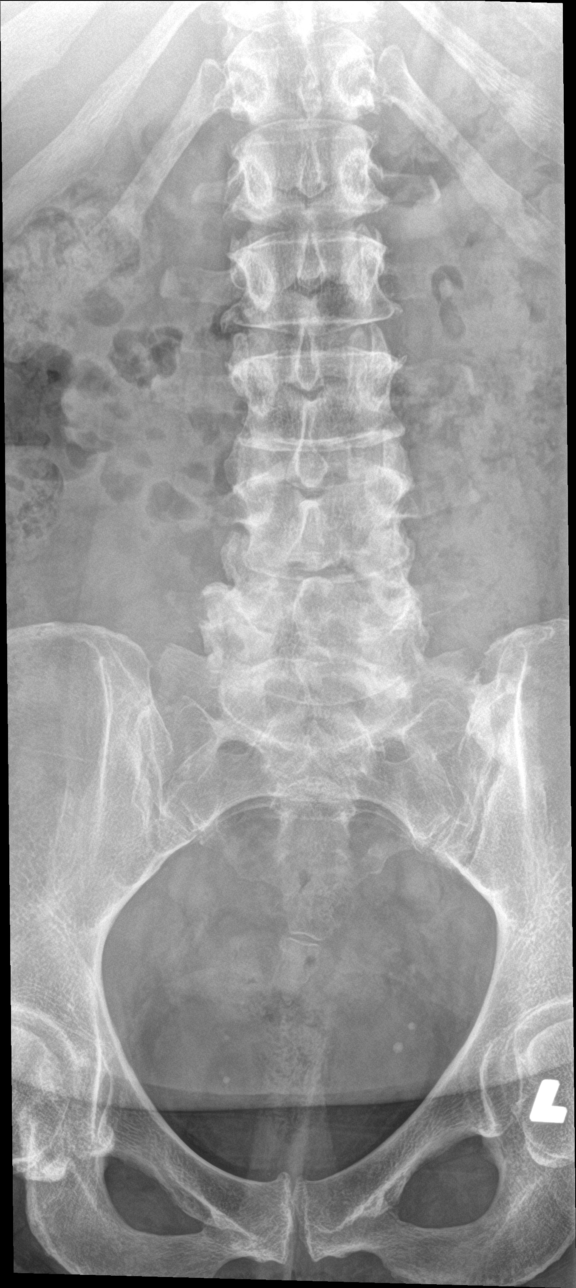

[l-spine obl (1 of 2)]
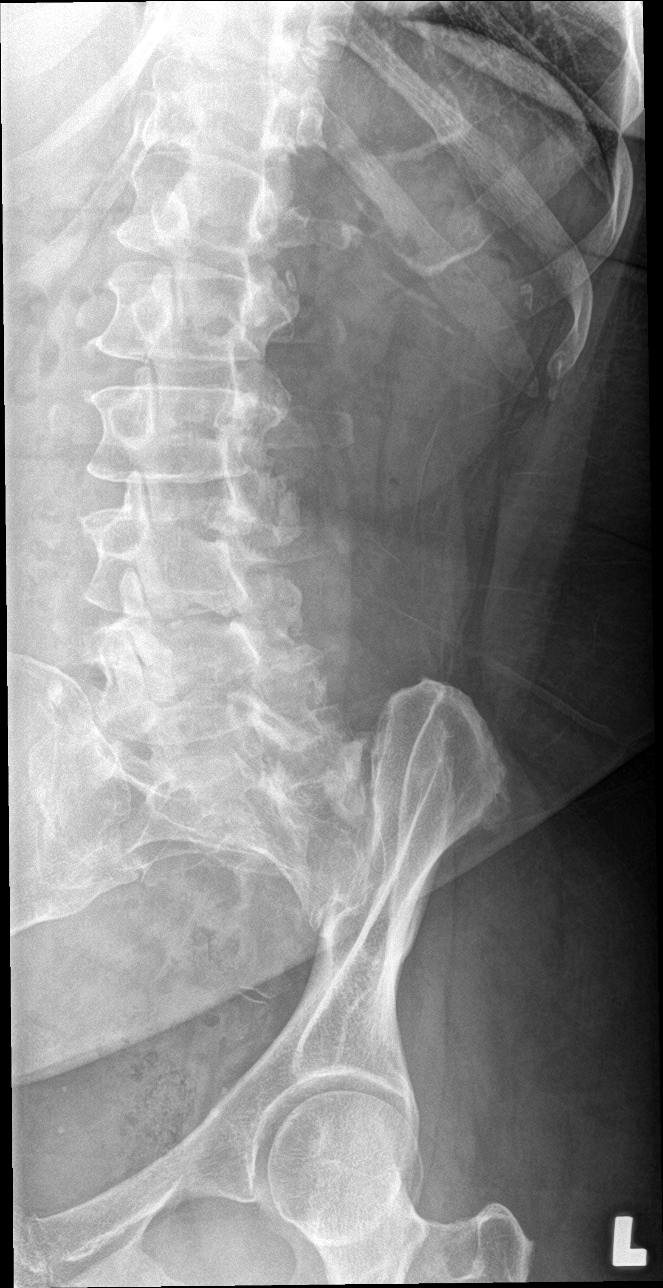

[l-spine obl (2 of 2)]
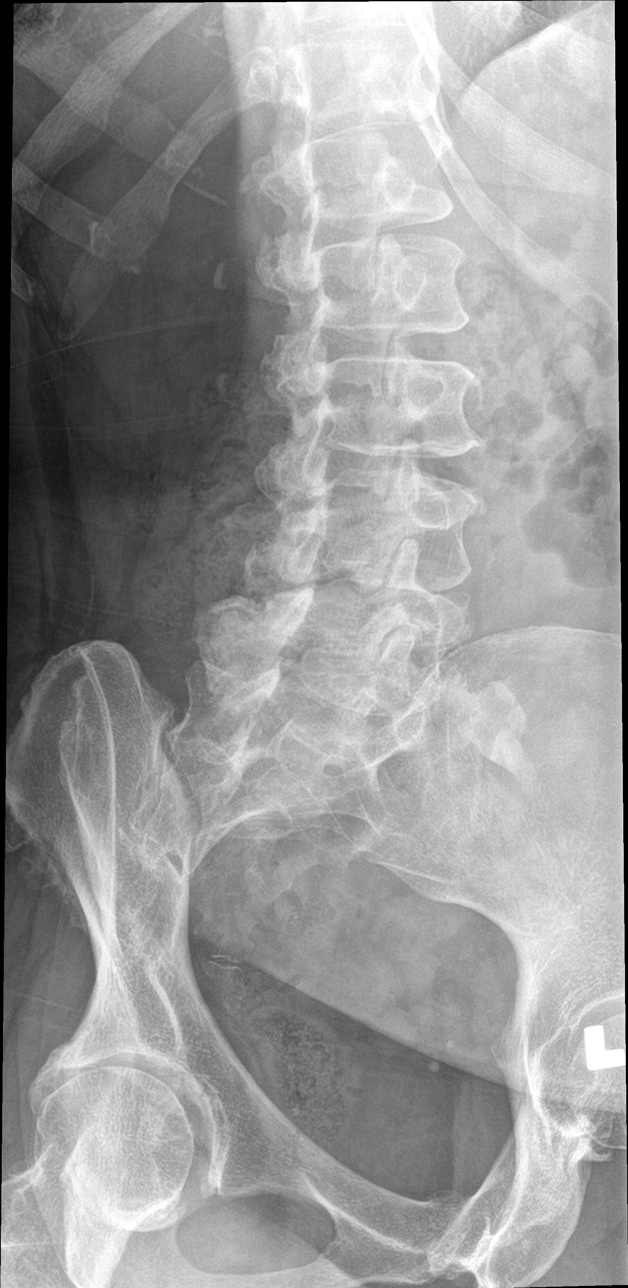

[l-spine lat]
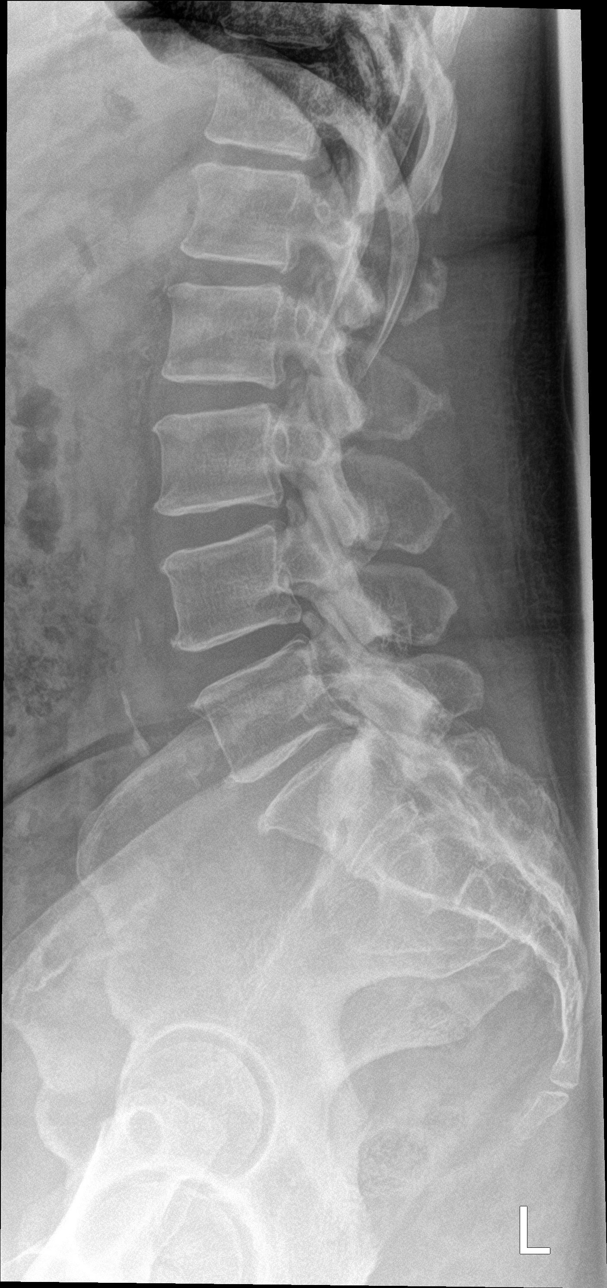

[l-spine spot]
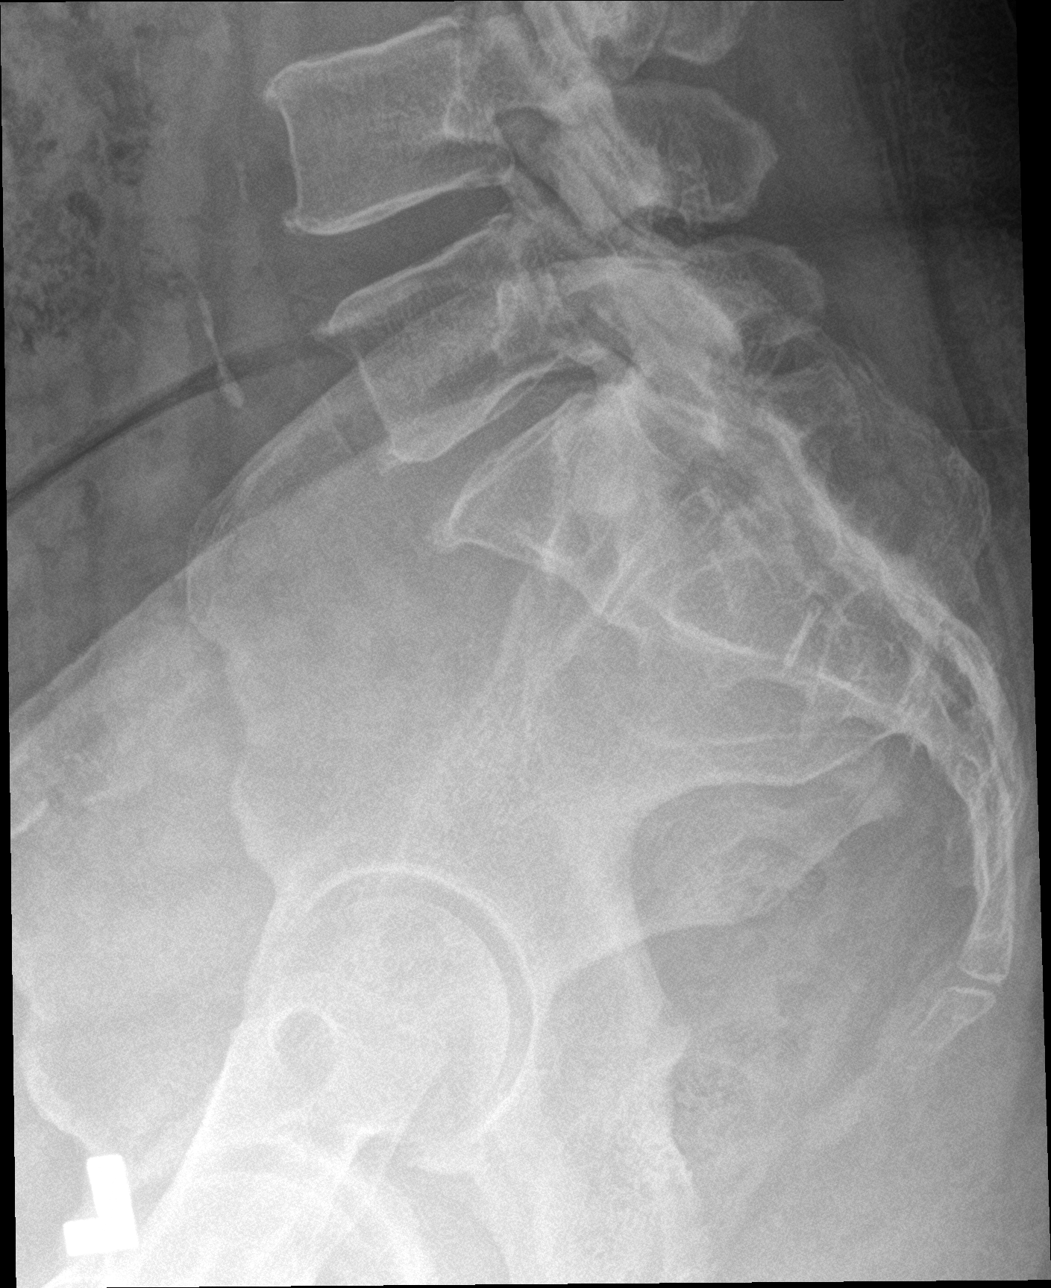

[5 of 5 positions shown; findings below may reference images not displayed]

FINDINGS: There are 5 non rib-bearing lumbar type vertebral bodies. Vertebral
body heights are preserved, without evidence of fracture. Alignment
is normal. There is no evidence of spondylolysis. The intervertebral
disc spaces are preserved. There is minimal degenerative endplate
change in the lower lumbar spine. There is mild facet arthropathy at
L5-S1.

The soft tissues are unremarkable.
IMPRESSION: Unremarkable lumbar spine radiographs. Given the history, MRI is
suggested.

## 2021-08-04 MED ORDER — BACLOFEN 10 MG PO TABS: 10.0000 mg | ORAL_TABLET | Freq: Three times a day (TID) | ORAL | 0 refills | Status: DC

## 2021-08-04 MED ORDER — PREDNISONE 20 MG PO TABS: ORAL_TABLET | ORAL | 0 refills | Status: DC

## 2021-08-04 MED ORDER — METHYLPREDNISOLONE ACETATE 80 MG/ML IJ SUSP
80.0000 mg | Freq: Once | INTRAMUSCULAR | Status: AC
  Administered 2021-08-04: 80 mg via INTRAMUSCULAR

## 2021-08-04 NOTE — ED Provider Notes (Signed)
[facility]    CSN: [id number] Arrival date & time: 08/04/21  1103      History   Chief Complaint Chief Complaint  Patient presents with   Leg Pain    Rt leg and groin    HPI Wendy Frye is a 55 y.o. female.   HPI 55 year old female presents with worsening right leg, right buttocks and right groin pain for 7 days.  Past Medical History:  Diagnosis Date   Hypertension    Hypertension 05/31/2017    Patient Active Problem List   Diagnosis Date Noted   Bilateral carpal tunnel syndrome 11/18/2018   Arthritis of knee, left 11/12/2017   Hypertension 05/31/2017   Muscle twitch 05/31/2017   History of vertigo 05/31/2017   Leiomyoma of uterus, unspecified 03/09/2014   Abnormal pelvic ultrasound 03/09/2014    Past Surgical History:  Procedure Laterality Date   BREAST ENHANCEMENT SURGERY     CARPAL TUNNEL RELEASE     CESAREAN SECTION      OB History   No obstetric history on file.      Home Medications    Prior to Admission medications   Medication Sig Start Date End Date Taking? Authorizing Provider  baclofen (LIORESAL) 10 MG tablet Take 1 tablet (10 mg total) by mouth 3 (three) times daily. 08/04/21  Yes Eliezer Lofts, FNP  predniSONE (DELTASONE) 20 MG tablet Take 3 tabs PO daily x 3 days, then 2 tabs PO daily x 3 days, then 1 tab PO daily x 3 days 08/04/21  Yes Eliezer Lofts, FNP  acetaminophen (TYLENOL) 500 MG tablet Take 2 tablets (1,000 mg total) by mouth every 6 (six) hours as needed. 07/28/21   Emeterio Reeve, DO  norethindrone (MICRONOR) 0.35 MG tablet Take 1 tablet by mouth daily.    [provider]  hydrochlorothiazide (HYDRODIURIL) 25 MG tablet Take 1 tablet (25 mg total) by mouth daily. Patient not taking: Reported on 05/11/2021 06/14/17 05/11/21  Emeterio Reeve, DO    Family History Family History  Problem Relation Age of Onset   Hypertension Mother    Diabetes Mother    Hyperlipidemia Mother    Alcohol abuse Father     Diabetes Maternal Grandmother    Hypertension Maternal Grandmother     Social History Social History   Tobacco Use   Smoking status: Never   Smokeless tobacco: Never  Substance Use Topics   Alcohol use: No   Drug use: No     Allergies   Darvon [propoxyphene], Asa [aspirin], Ciprofloxacin, Motrin [ibuprofen], Other, and Penicillins   Review of Systems Review of Systems  Musculoskeletal:  Positive for back pain.  All other systems reviewed and are negative.   Physical Exam Triage Vital Signs ED Triage Vitals  Enc Vitals Group     BP 08/04/21 1113 (!) 144/88     Pulse Rate 08/04/21 1113 (!) 56     Resp 08/04/21 1113 14     Temp 08/04/21 1113 98.4 F (36.9 C)     Temp Source 08/04/21 1113 Oral     SpO2 08/04/21 1113 97 %     Weight 08/04/21 1115 184 lb (83.5 kg)     Height 08/04/21 1115 '5\' 3"'$  (1.6 m)     Head Circumference --      Peak Flow --      Pain Score 08/04/21 1115 10     Pain Loc --      Pain Edu? --      Excl. in  GC? --    No data found.  Updated Vital Signs BP (!) 144/88 (BP Location: Left Arm)   Pulse (!) 56   Temp 98.4 F (36.9 C) (Oral)   Resp 14   Ht '5\' 3"'$  (1.6 m)   Wt 184 lb (83.5 kg)   SpO2 97%   BMI 32.59 kg/m      Physical Exam Vitals and nursing note reviewed.  Constitutional:      General: She is not in acute distress.    Appearance: Normal appearance. She is normal weight. She is ill-appearing.  HENT:     Head: Normocephalic and atraumatic.     Mouth/Throat:     Mouth: Mucous membranes are moist.     Pharynx: Oropharynx is clear.  Eyes:     Extraocular Movements: Extraocular movements intact.     Conjunctiva/sclera: Conjunctivae normal.     Pupils: Pupils are equal, round, and reactive to light.  Cardiovascular:     Rate and Rhythm: Normal rate and regular rhythm.     Pulses: Normal pulses.     Heart sounds: Normal heart sounds.  Pulmonary:     Effort: Pulmonary effort is normal.     Breath sounds: Normal breath  sounds. No wheezing, rhonchi or rales.  Musculoskeletal:        General: Normal range of motion.     Cervical back: Normal range of motion.     Comments: Lumbar sacral spine (inferior aspect): TTP over spinous processes, paraspinous muscles and right sided spinal erectors, with multiple palpable muscle adhesions noted, deformity noted  Skin:    General: Skin is warm and dry.  Neurological:     General: No focal deficit present.     Mental Status: She is alert and oriented to person, place, and time. Mental status is at baseline.  Psychiatric:        Mood and Affect: Mood normal.        Behavior: Behavior normal.        Thought Content: Thought content normal.     UC Treatments / Results  Labs (all labs ordered are listed, but only abnormal results are displayed) Labs Reviewed - No data to display  EKG   Radiology DG Lumbar Spine Complete  Result Date: 08/04/2021 CLINICAL DATA:  Lumbar radiculopathy EXAM: LUMBAR SPINE - COMPLETE 4+ VIEW COMPARISON:  CT abdomen/pelvis 02/03/2015 FINDINGS: There are 5 non rib-bearing lumbar type vertebral bodies. Vertebral body heights are preserved, without evidence of fracture. Alignment is normal. There is no evidence of spondylolysis. The intervertebral disc spaces are preserved. There is minimal degenerative endplate change in the lower lumbar spine. There is mild facet arthropathy at L5-S1. The soft tissues are unremarkable. IMPRESSION: Unremarkable lumbar spine radiographs. Given the history, MRI is suggested. Electronically Signed   By: Valetta Mole M.D.   On: 08/04/2021 12:25    Procedures Procedures (including critical care time)  Medications Ordered in UC Medications  methylPREDNISolone acetate (DEPO-MEDROL) injection 80 mg (80 mg Intramuscular Given 08/04/21 1251)    Initial Impression / Assessment and Plan / UC Course  I have reviewed the triage vital signs and the nursing notes.  Pertinent labs & imaging results that were available  during my care of the patient were reviewed by me and considered in my medical decision making (see chart for details).     MDM: 1.  Lumbar sacral radiculopathy-Rx'd Prednisone taper; 2.  Lumbar strain-Rx'd Baclofen. Advised/instructed patient to start oral prednisone taper tomorrow morning, Friday, 08/05/2021.  Advised patient to take medication as directed with food to completion encouraged patient to increase daily water intake while taking these medications.  Advised/instructed patient to avoid moderate to strenuous activities; including but not limited to, lifting, twisting, turning, bending, stooping, and/or other repetitive movements involving affected area of right sided lower back and right buttocks for the next 7 to 10 days.  Patient discharged home, hemodynamically stable. Final Clinical Impressions(s) / UC Diagnoses   Final diagnoses:  Lumbosacral radiculopathy  Lumbar strain, initial encounter     Discharge Instructions      Advised/instructed patient to start oral prednisone taper tomorrow morning, Friday, 08/05/2021.  Advised patient to take medication as directed with food to completion encouraged patient to increase daily water intake while taking these medications.  Advised/instructed patient to avoid moderate to strenuous activities; including but not limited to, lifting, twisting, turning, bending, stooping, and/or other repetitive movements involving affected area of right sided lower back and right buttocks for the next 7 to 10 days.     ED Prescriptions     Medication Sig Dispense Auth. Provider   predniSONE (DELTASONE) 20 MG tablet Take 3 tabs PO daily x 3 days, then 2 tabs PO daily x 3 days, then 1 tab PO daily x 3 days 18 tablet Eliezer Lofts, FNP   baclofen (LIORESAL) 10 MG tablet Take 1 tablet (10 mg total) by mouth 3 (three) times daily. 42 each Eliezer Lofts, FNP      PDMP not reviewed this encounter.   Eliezer Lofts, [place] 08/04/21 1256

## 2021-08-04 NOTE — Discharge Instructions (Addendum)
Advised/instructed patient to start oral prednisone taper tomorrow morning, Friday, 08/05/2021.  Advised patient to take medication as directed with food to completion encouraged patient to increase daily water intake while taking these medications.  Advised/instructed patient to avoid moderate to strenuous activities; including but not limited to, lifting, twisting, turning, bending, stooping, and/or other repetitive movements involving affected area of right sided lower back and right buttocks for the next 7 to 10 days.

## 2021-08-04 NOTE — ED Triage Notes (Signed)
Pt seen in UC w/ c/o worsening rt leg, buttock, and groin pain that has become excruciating over the last week. Pt took aleve this morning for her pain. Pt declined tylenol at this time.

## 2021-08-11 ENCOUNTER — Ambulatory Visit: Payer: Self-pay | Admitting: Rehabilitative and Restorative Service Providers"

## 2021-10-25 ENCOUNTER — Other Ambulatory Visit: Payer: Self-pay

## 2021-10-25 ENCOUNTER — Ambulatory Visit (INDEPENDENT_AMBULATORY_CARE_PROVIDER_SITE_OTHER): Payer: Self-pay | Admitting: Sports Medicine

## 2021-10-25 DIAGNOSIS — M25551 Pain in right hip: Secondary | ICD-10-CM

## 2021-10-25 DIAGNOSIS — M1611 Unilateral primary osteoarthritis, right hip: Secondary | ICD-10-CM | POA: Insufficient documentation

## 2021-10-25 MED ORDER — MELOXICAM 15 MG PO TABS: ORAL_TABLET | ORAL | 3 refills | Status: DC

## 2021-10-25 NOTE — Progress Notes (Signed)
    Procedures performed today:    None.  Independent interpretation of notes and tests performed by another provider:   None.  Brief History, Exam, Impression, and Recommendations:    Right hip pain This is a pleasant 55 year old female, she had about 4 months of pain in her right hip, localized in the groin with radiation around to the buttock, into the upper thigh but not past the lower thigh or into the knee or foot. On exam she has relatively good motion with may be a 10 degree internal rotation lag on the right compared to the left, there is reproduction of pain with resisted hip flexion. Negative FADIR sign. 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 Adding hip flexor conditioning, considering 4 months of discomfort with unrevealing x-rays we will proceed with an MRI of the lumbar spine and the right hip without contrast. Adding meloxicam which she can take with her Tylenol. Return to see me to go over MRI results. She is self-pay so I do not think we need an additional x-ray of her hip to get approval for the hip MRI.    ___________________________________________ Gwen Her. Dianah Field, M.D., ABFM., CAQSM. Primary Care and [place] Instructor of [place] of [facility] of Medicine

## 2021-10-25 NOTE — Assessment & Plan Note (Signed)
This is a pleasant 55 year old female, she had about 4 months of pain in her right hip, localized in the groin with radiation around to the buttock, into the upper thigh but not past the lower thigh or into the knee or foot. On exam she has relatively good motion with may be a 10 degree internal rotation lag on the right compared to the left, there is reproduction of pain with resisted hip flexion. Negative FADIR sign. 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 Adding hip flexor conditioning, considering 4 months of discomfort with unrevealing x-rays we will proceed with an MRI of the lumbar spine and the right hip without contrast. Adding meloxicam which she can take with her Tylenol. Return to see me to go over MRI results. She is self-pay so I do not think we need an additional x-ray of her hip to get approval for the hip MRI.

## 2021-10-29 ENCOUNTER — Other Ambulatory Visit: Payer: Self-pay

## 2021-10-29 ENCOUNTER — Ambulatory Visit (INDEPENDENT_AMBULATORY_CARE_PROVIDER_SITE_OTHER): Payer: Self-pay

## 2021-10-29 DIAGNOSIS — G8929 Other chronic pain: Secondary | ICD-10-CM

## 2021-10-29 DIAGNOSIS — M25551 Pain in right hip: Secondary | ICD-10-CM

## 2021-10-29 DIAGNOSIS — M545 Low back pain, unspecified: Secondary | ICD-10-CM

## 2021-10-29 IMAGING — MR MR HIP*R* W/O CM
4 of 5 series · 20 of 40 positions shown · non-contrast
Comparison: None.

CLINICAL DATA: Hip pain, chronic, osteoarthritis suspected Right
hip pain, question hip flexor tendinopathy versus articular
derangement

EXAM:
MR OF THE RIGHT HIP WITHOUT CONTRAST
TECHNIQUE: Multiplanar, multisequence MR imaging was performed. No intravenous
contrast was administered.

[Series 2: T1 · coronal · 4.0mm · 0.85mm/px · 3 of 40 slices shown]
[im 5/40]
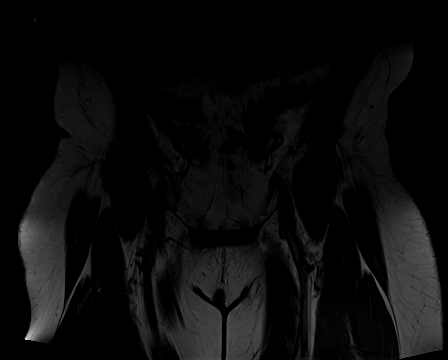
[im 22/40]
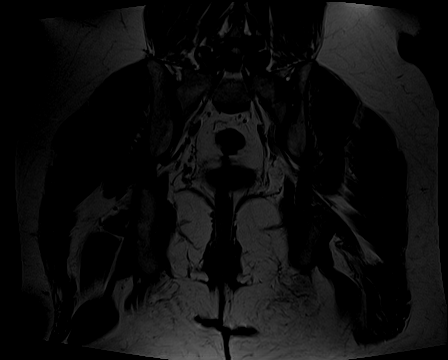
[im 35/40]
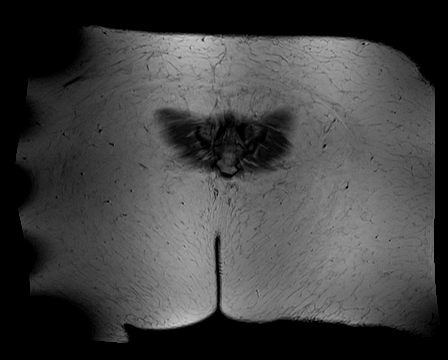

[Series 4: T2 fat-sat · axial · 4.0mm · 0.62mm/px · z∈[-52,+73]mm · 5 of 32 slices shown]
[im 1/32]
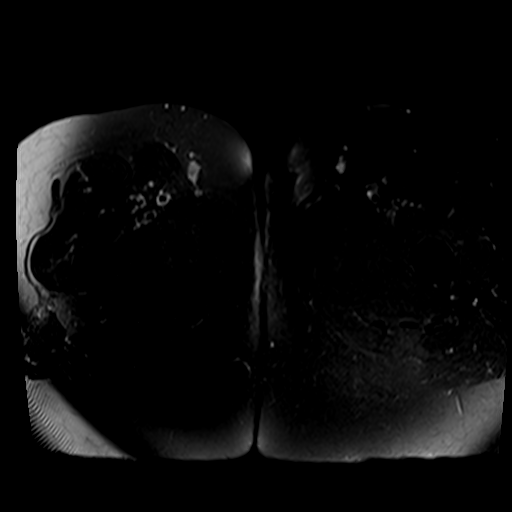
[im 5/32]
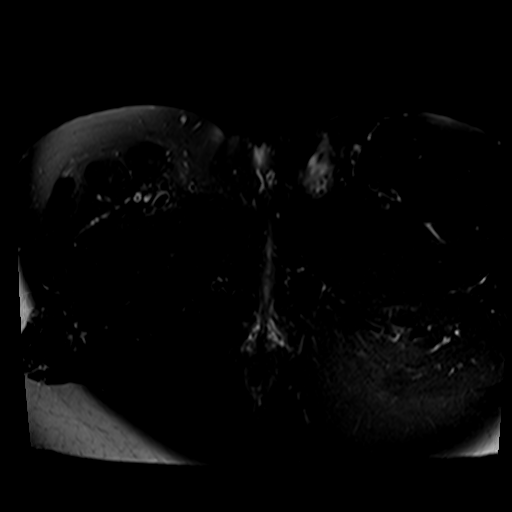
[im 9/32]
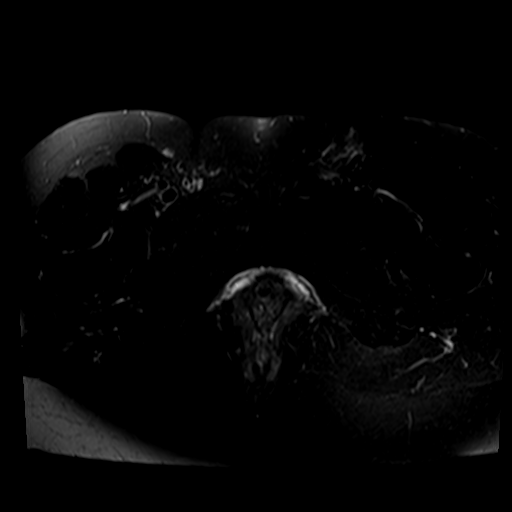
[im 18/32]
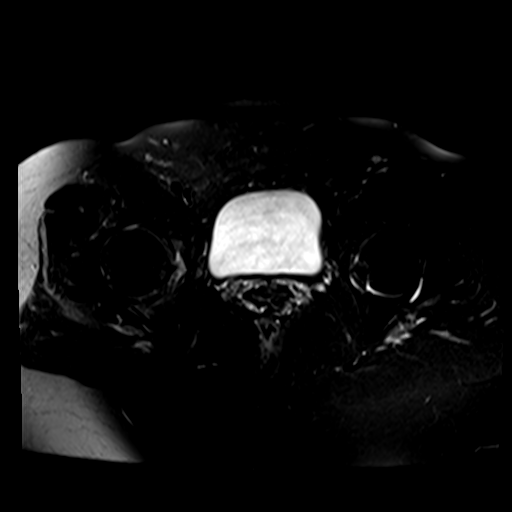
[im 27/32]
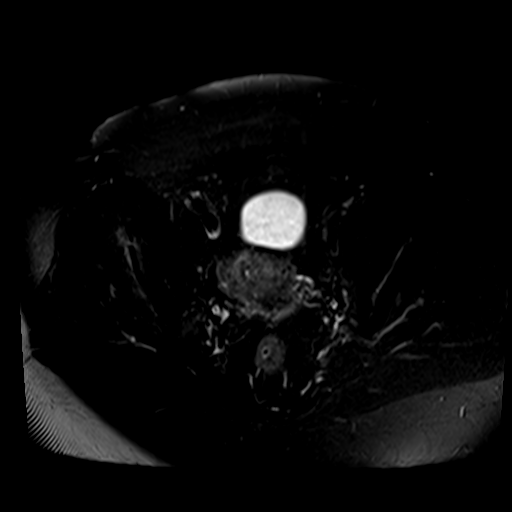

[Series 7: PD fat-sat · sagittal · 4.5mm · 0.35mm/px · 6 of 24 slices shown (1 of 2)]
[im 1/24]
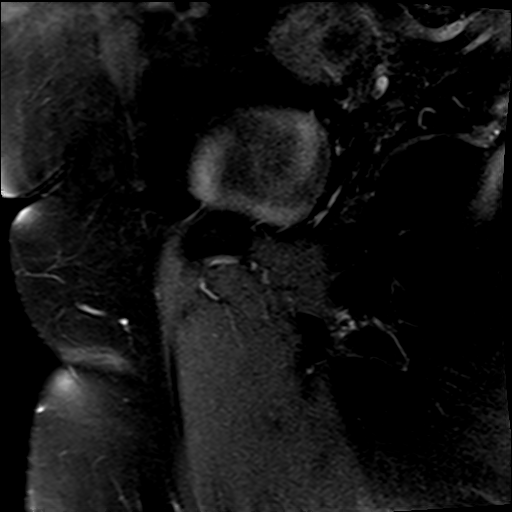
[im 5/24]
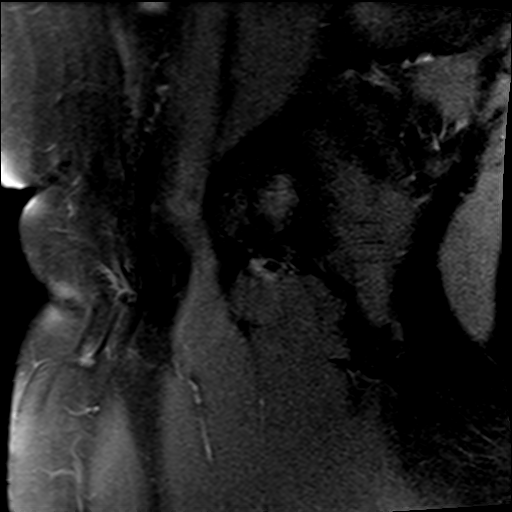
[im 10/24]
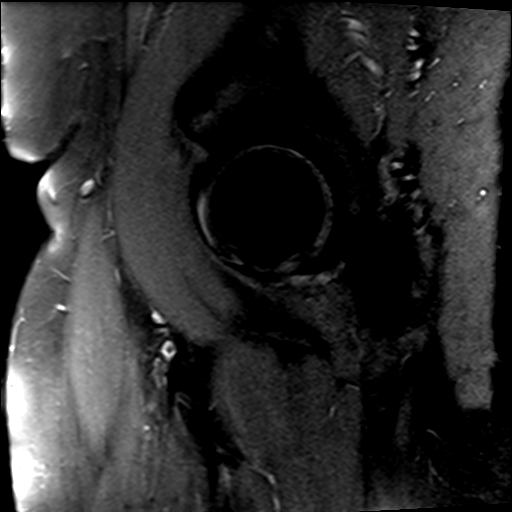
[im 14/24]
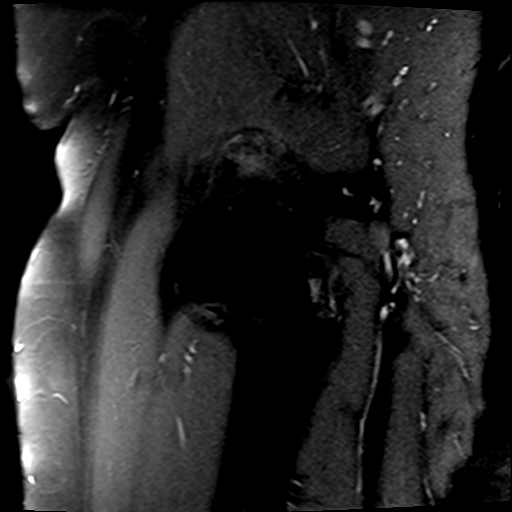
[im 19/24]
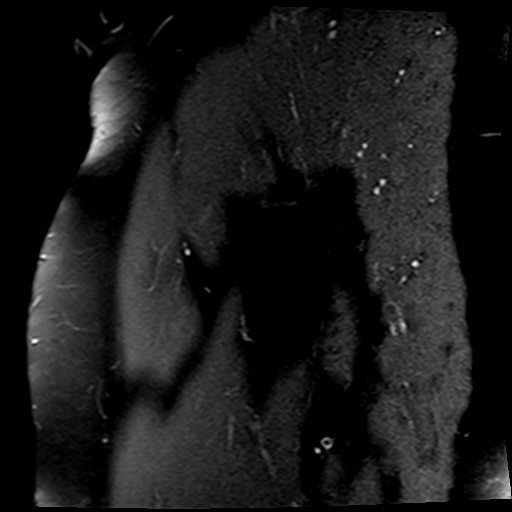
[im 24/24]
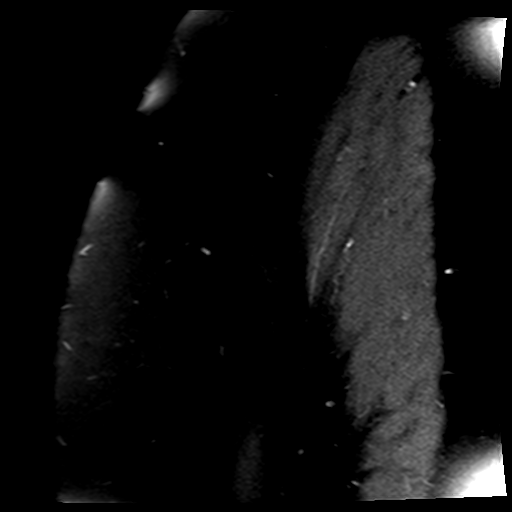

[Series 8: PD fat-sat · coronal · 4.5mm · 0.35mm/px · 6 of 23 slices shown (2 of 2)]
[im 1/23]
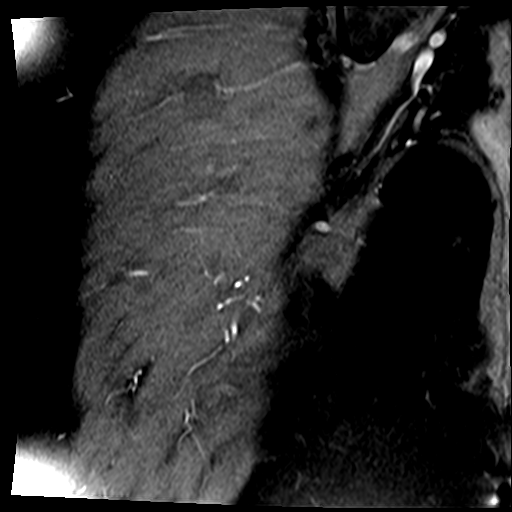
[im 5/23]
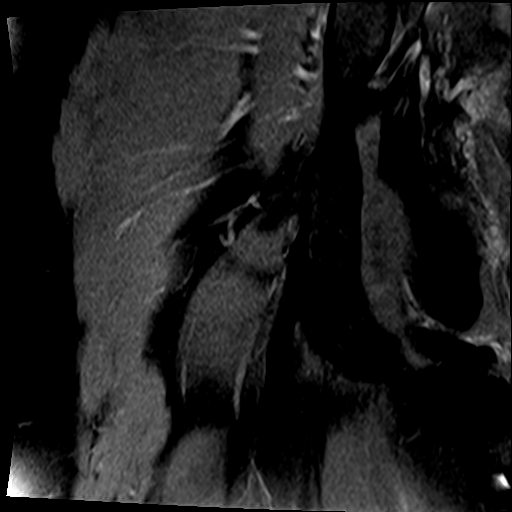
[im 9/23]
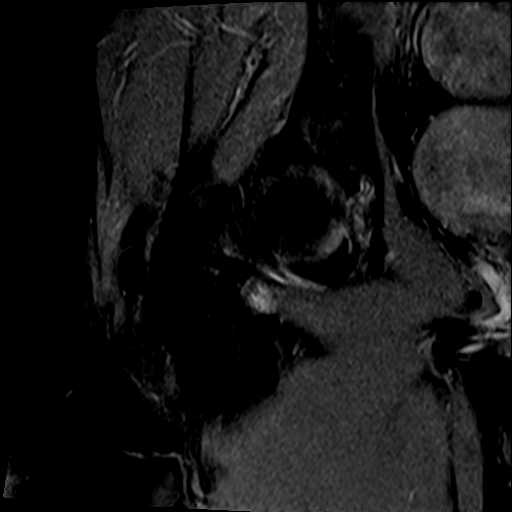
[im 14/23]
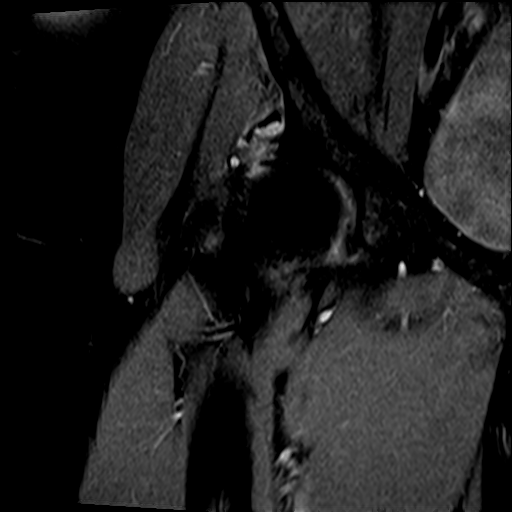
[im 18/23]
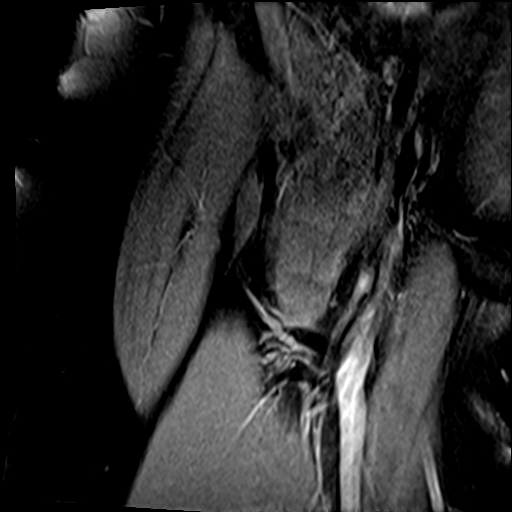
[im 23/23]
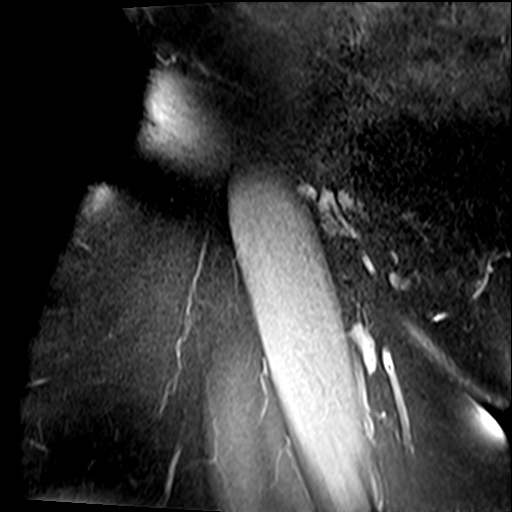

[20 of 40 positions shown; findings below may reference images not displayed]

FINDINGS: Bones: There is no evidence of acute fracture, dislocation or
avascular necrosis. No focal bone lesion. The visualized sacroiliac
joints and symphysis pubis appear normal. Lower lumbar spine
degenerative disc disease, see separately dictated lumbar spine MRI.

Articular cartilage and labrum

Articular cartilage: High-grade cartilage loss along the
superolateral joint. Underlying marrow edema and subchondral cystic
change.

Labrum: Degenerative superior labral tearing. Adjacent paralabral
cyst formation along the superolateral joint, measuring up to 2.4 x
0.8 cm (series 8, image 13).

Joint or bursal effusion

Joint effusion: Trace joint effusion with nonspecific synovitis.

Bursae: No evidence of trochanteric bursitis.

Muscles and tendons

Muscles and tendons: The gluteal tendons are intact. Very mild
left-sided gluteal tendon peritendinitis distally. The proximal
hamstrings are intact.The adductors are intact. No muscle atrophy of
edema.

Other findings

Miscellaneous: Colonic diverticulosis. 1.8 cm right-sided uterine
fibroid.
IMPRESSION: Moderate to severe osteoarthritis of the right hip with high-grade
cartilage loss, degenerative superior labral tearing, and paralabral
cyst formation. Adjacent reactive periarticular bony edema. Trace
joint effusion with nonspecific synovitis.

## 2021-10-29 IMAGING — MR MR LUMBAR SPINE W/O CM
4 of 5 series · 25 of 48 positions shown · non-contrast
Comparison: None.

CLINICAL DATA: Low back pain, > 6 wks Right-sided low back pain
with radiation into the groin, and back of the thigh

EXAM:
MRI LUMBAR SPINE WITHOUT CONTRAST
TECHNIQUE: Multiplanar, multisequence MR imaging of the lumbar spine was
performed. No intravenous contrast was administered.

[Series 2: T2 · sagittal · 4.0mm · 0.81mm/px · 7 of 15 slices shown (1 of 2)]
[im 1/15]
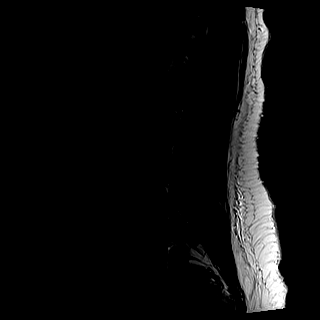
[im 3/15]
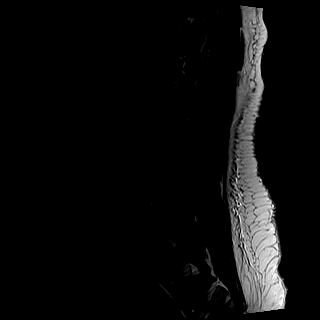
[im 5/15]
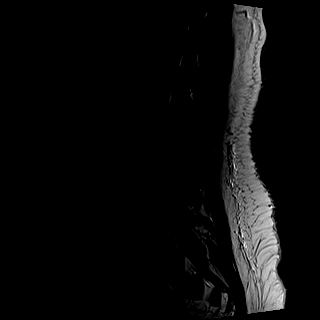
[im 8/15]
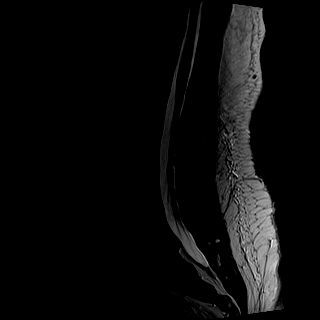
[im 10/15]
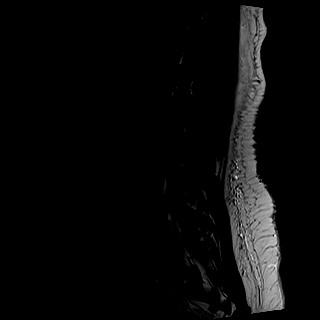
[im 12/15]
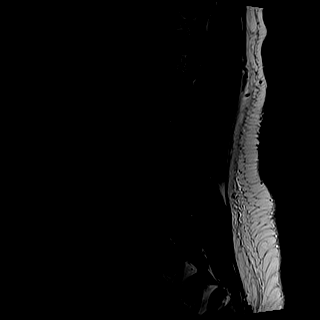
[im 15/15]
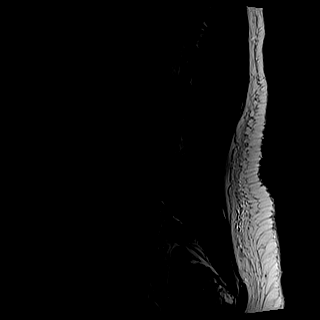

[Series 3: T1 · sagittal · 4.0mm · 0.41mm/px · 7 of 15 slices shown (1 of 2)]
[im 1/15]
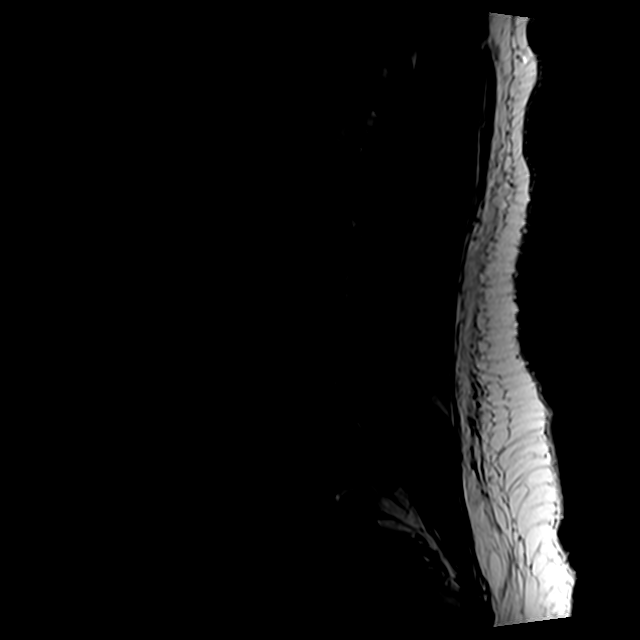
[im 3/15]
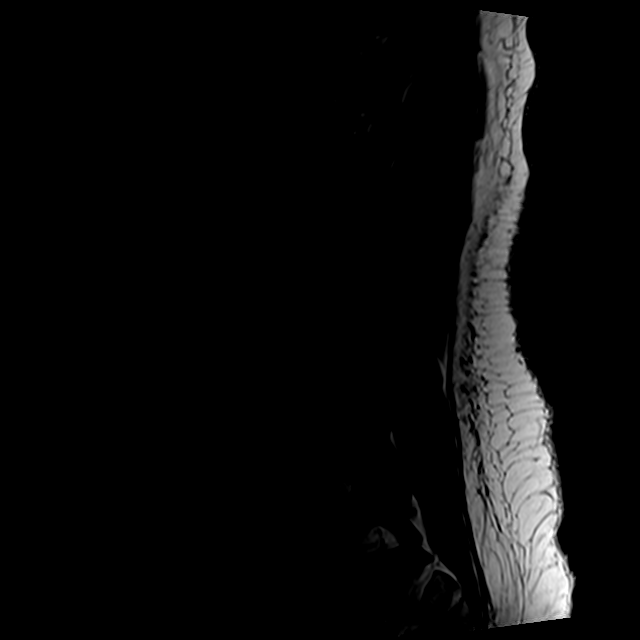
[im 5/15]
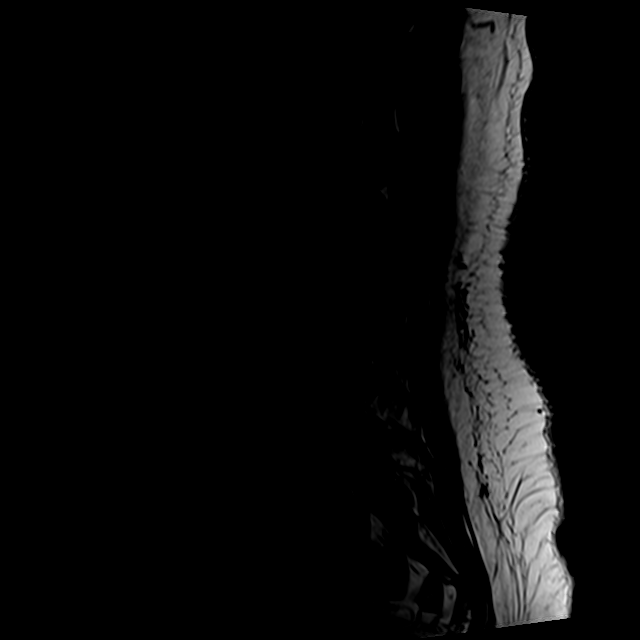
[im 8/15]
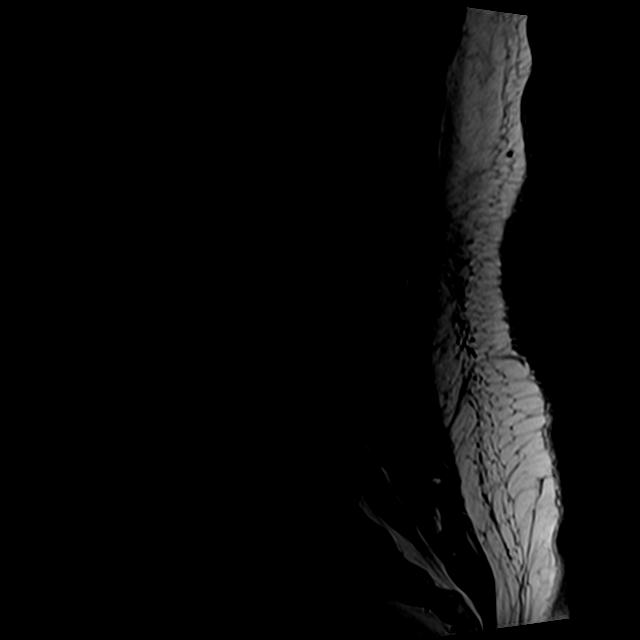
[im 10/15]
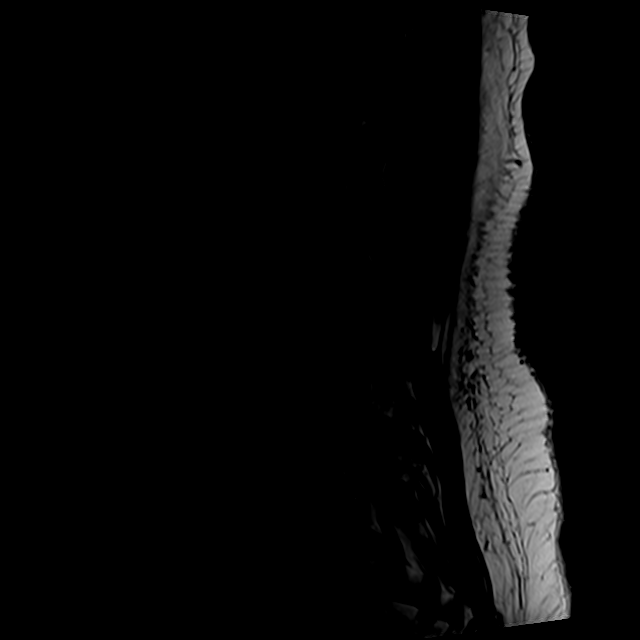
[im 12/15]
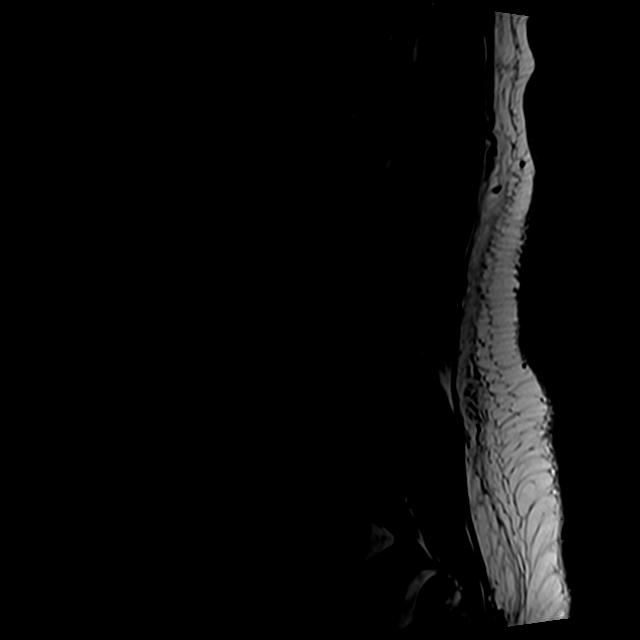
[im 15/15]
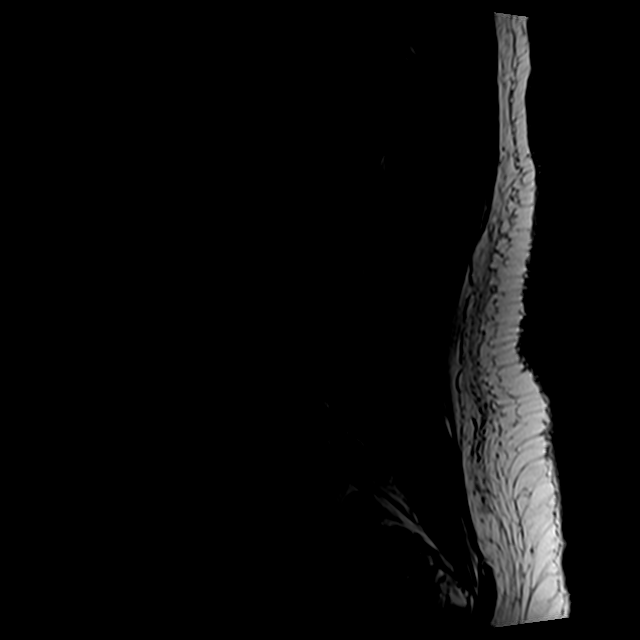

[Series 5: T2 · axial · 4.0mm · 0.78mm/px · z∈[-124,+71]mm · 8 of 34 slices shown (2 of 2)]
[im 1/34]
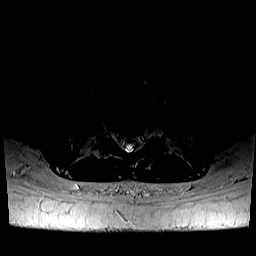
[im 6/34]
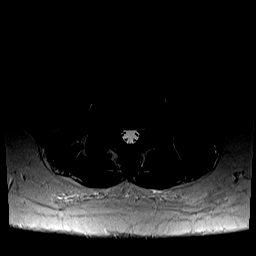
[im 11/34]
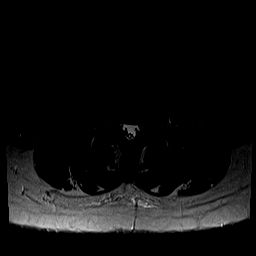
[im 16/34]
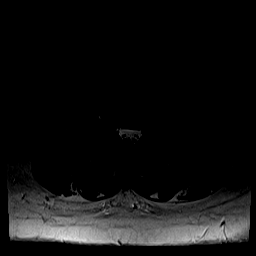
[im 18/34]
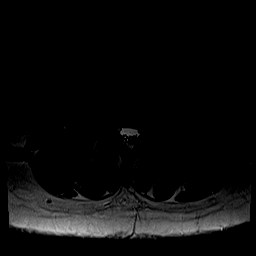
[im 23/34]
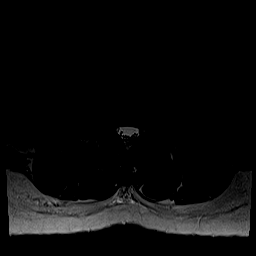
[im 28/34]
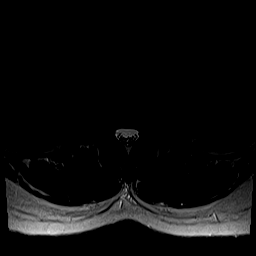
[im 34/34]
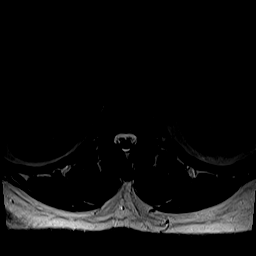

[Series 6: T1 · axial · 4.0mm · 0.39mm/px · z∈[-100,+41]mm · 3 of 34 slices shown (2 of 2)]
[im 6/34]
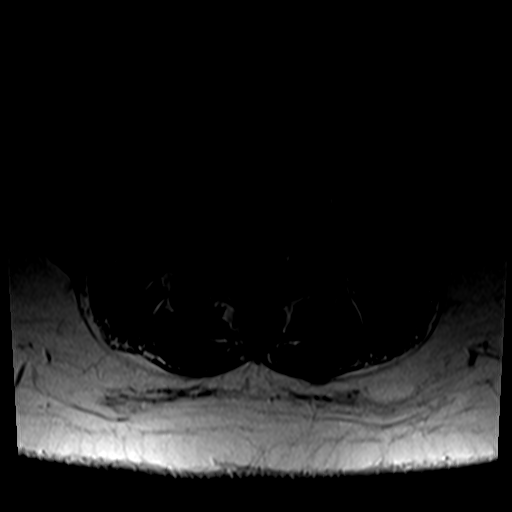
[im 18/34]
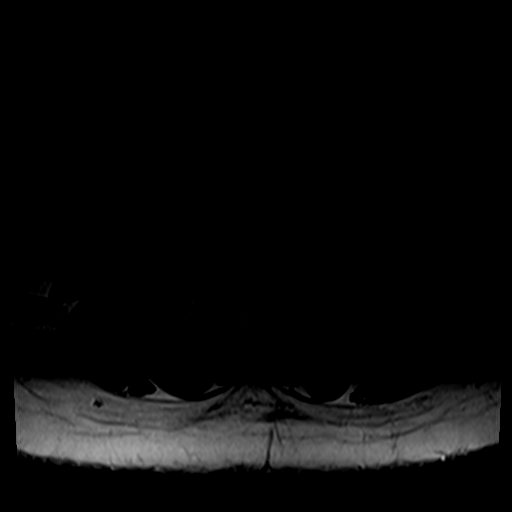
[im 28/34]
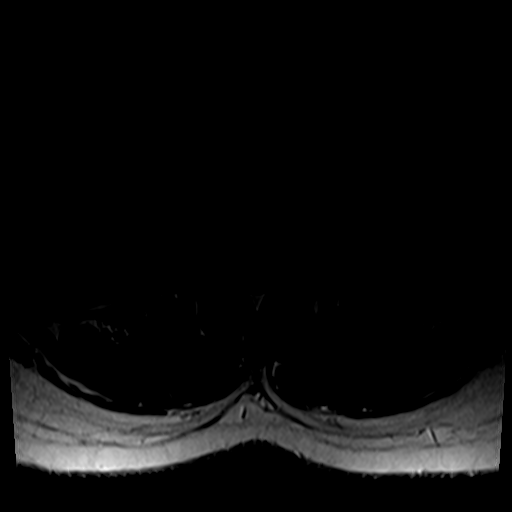

[25 of 48 positions shown; findings below may reference images not displayed]

FINDINGS: Segmentation:  Standard.

Alignment:  Preserved.

Vertebrae: Vertebral body heights are maintained. No marrow edema.
No suspicious osseous lesion.

Conus medullaris and cauda equina: Conus extends to the L1-L2 level.
Conus and cauda equina appear normal.

Paraspinal and other soft tissues: Unremarkable.

Disc levels:

L1-L2:  No canal or foraminal stenosis.

L2-L3:  No canal or foraminal stenosis.

L3-L4:  No canal or foraminal stenosis.

L4-L5: Shallow left foraminal protrusion. Mild facet arthropathy. No
canal or right foraminal stenosis. Minor left foraminal stenosis.

L5-S1: Disc bulge with small central and right far lateral disc
protrusions. Moderate facet arthropathy. No canal or left foraminal
stenosis. Minor right foraminal stenosis. Disc contacts the exiting
right L5 nerve root.
IMPRESSION: Lower lumbar degenerative changes as detailed above. Disc contacts
the exiting right L5 nerve root at L5-S1, a potential source of
right-sided symptoms.

## 2021-11-02 ENCOUNTER — Ambulatory Visit: Payer: Self-pay

## 2021-11-02 ENCOUNTER — Other Ambulatory Visit: Payer: Self-pay

## 2021-11-02 ENCOUNTER — Ambulatory Visit (INDEPENDENT_AMBULATORY_CARE_PROVIDER_SITE_OTHER): Payer: Self-pay | Admitting: Sports Medicine

## 2021-11-02 DIAGNOSIS — M25551 Pain in right hip: Secondary | ICD-10-CM

## 2021-11-02 DIAGNOSIS — M1611 Unilateral primary osteoarthritis, right hip: Secondary | ICD-10-CM

## 2021-11-02 NOTE — Progress Notes (Signed)
    Procedures performed today:    None.  Independent interpretation of notes and tests performed by another provider:   None.  Brief History, Exam, Impression, and Recommendations:    Primary osteoarthritis of right hip This pleasant 55 year old female returns, she has had chronic right hip pain, she has been treated with multiple modalities historically, with persistent pain, ultimately we obtained an MRI of the hip and the lumbar spine, hip MRI did show severe osteoarthritis, we added some meloxicam, she returns today with dramatic relief in discomfort, she will play around with 1/2-1 tab daily, and return to see me as needed for injection. Very happy with results.    ___________________________________________ Gwen Her. Dianah Field, M.D., ABFM., CAQSM. Primary Care and [place] Instructor of [place] of [facility] of Medicine

## 2021-11-02 NOTE — Assessment & Plan Note (Signed)
This pleasant 55 year old female returns, she has had chronic right hip pain, she has been treated with multiple modalities historically, with persistent pain, ultimately we obtained an MRI of the hip and the lumbar spine, hip MRI did show severe osteoarthritis, we added some meloxicam, she returns today with dramatic relief in discomfort, she will play around with 1/2-1 tab daily, and return to see me as needed for injection. Very happy with results.

## 2021-11-30 ENCOUNTER — Other Ambulatory Visit: Payer: Self-pay

## 2021-11-30 ENCOUNTER — Ambulatory Visit (INDEPENDENT_AMBULATORY_CARE_PROVIDER_SITE_OTHER): Payer: Self-pay | Admitting: Sports Medicine

## 2021-11-30 ENCOUNTER — Ambulatory Visit (INDEPENDENT_AMBULATORY_CARE_PROVIDER_SITE_OTHER): Payer: Self-pay

## 2021-11-30 DIAGNOSIS — M1611 Unilateral primary osteoarthritis, right hip: Secondary | ICD-10-CM

## 2021-11-30 NOTE — Progress Notes (Signed)
° ° °  Procedures performed today:    Procedure: Real-time Ultrasound Guided injection of the right hip joint Device: Samsung [id number]  Verbal informed consent obtained.  Time-out conducted.  Noted no overlying erythema, induration, or other signs of local infection.  Skin prepped in a sterile fashion.  Local anesthesia: Topical Ethyl chloride.  With sterile technique and under real time ultrasound guidance: Noted arthritic joint, 1 cc Kenalog 40, 2 cc lidocaine, 2 cc bupivacaine injected easily Completed without difficulty  Advised to call if fevers/chills, erythema, induration, drainage, or persistent bleeding.  Images permanently stored and available for review in PACS.  Impression: Technically successful ultrasound guided injection.  Independent interpretation of notes and tests performed by another provider:   None.  Brief History, Exam, Impression, and Recommendations:    Primary osteoarthritis of right hip Known arthritis right hip, groin pain, partially improved with meloxicam but inconsistent with taking the medication, she would like to proceed with hip joint injection today, right hip joint injection performed today under ultrasound guidance. This was a chronic process with exacerbation and pharmacologic intervention, return to see me in 4 weeks as needed.    ___________________________________________ Gwen Her. Dianah Field, M.D., ABFM., CAQSM. Primary Care and [place] Instructor of [place] of [facility] of Medicine

## 2021-11-30 NOTE — Assessment & Plan Note (Signed)
Known arthritis right hip, groin pain, partially improved with meloxicam but inconsistent with taking the medication, she would like to proceed with hip joint injection today, right hip joint injection performed today under ultrasound guidance. This was a chronic process with exacerbation and pharmacologic intervention, return to see me in 4 weeks as needed.

## 2022-08-07 ENCOUNTER — Other Ambulatory Visit: Payer: Self-pay | Admitting: Sports Medicine

## 2022-08-07 DIAGNOSIS — M25551 Pain in right hip: Secondary | ICD-10-CM

## 2022-08-18 ENCOUNTER — Other Ambulatory Visit: Payer: Self-pay

## 2022-08-18 DIAGNOSIS — M25551 Pain in right hip: Secondary | ICD-10-CM

## 2022-08-18 MED ORDER — MELOXICAM 15 MG PO TABS: ORAL_TABLET | ORAL | 3 refills | Status: DC

## 2022-12-10 ENCOUNTER — Other Ambulatory Visit: Payer: Self-pay | Admitting: Sports Medicine

## 2022-12-10 DIAGNOSIS — M25551 Pain in right hip: Secondary | ICD-10-CM

## 2023-04-28 ENCOUNTER — Ambulatory Visit
Admission: RE | Admit: 2023-04-28 | Discharge: 2023-04-28 | Disposition: A | Discharge status: Home / Self Care | Payer: Self-pay | Source: Ambulatory Visit | Attending: Internal Medicine | Admitting: Internal Medicine

## 2023-04-28 VITALS — BP 164/106 | HR 78 | Temp 99.6°F | Resp 16 | Ht 63.0 in | Wt 190.0 lb

## 2023-04-28 DIAGNOSIS — R058 Other specified cough: Secondary | ICD-10-CM

## 2023-04-28 DIAGNOSIS — R030 Elevated blood-pressure reading, without diagnosis of hypertension: Secondary | ICD-10-CM

## 2023-04-28 MED ORDER — AMLODIPINE BESYLATE 10 MG PO TABS: 10.0000 mg | ORAL_TABLET | Freq: Every day | ORAL | 1 refills | Status: DC

## 2023-04-28 MED ORDER — FLUTICASONE PROPIONATE 50 MCG/ACT NA SUSP: 1.0000 | Freq: Every day | NASAL | 0 refills | Status: AC

## 2023-04-28 MED ORDER — GUAIFENESIN ER 600 MG PO TB12: 600.0000 mg | ORAL_TABLET | Freq: Two times a day (BID) | ORAL | 0 refills | Status: AC | PRN

## 2023-04-28 MED ORDER — BENZONATATE 200 MG PO CAPS
200.0000 mg | ORAL_CAPSULE | Freq: Three times a day (TID) | ORAL | 0 refills | Status: AC | PRN
Start: 2023-04-28 — End: ?

## 2023-04-28 MED ORDER — AMLODIPINE BESYLATE 5 MG PO TABS: 5.0000 mg | ORAL_TABLET | Freq: Every day | ORAL | 1 refills | Status: AC

## 2023-04-28 NOTE — Discharge Instructions (Addendum)
Please maintain adequate hydration Please take medications as prescribed Continue to exercise as tolerated Please maintain a log of your blood pressures at home No indication for antibiotics at this time If you have worsening symptoms please return to urgent care to be reevaluated.

## 2023-04-28 NOTE — ED Triage Notes (Signed)
Per pt, " I have a lot of chest congestion and nasal congestion and cough since last Saturday" Productive cough - clear drainage  Has not checked temp at home   OTC cold & flu meds  - min relief

## 2023-04-28 NOTE — ED Provider Notes (Signed)
[name]    CSN: [id number] Arrival date & time: 04/28/23  0848      History   Chief Complaint Chief Complaint  Patient presents with   Cough    I have a lot of chest congestion and nasal congestion and cough. - Entered by patient    HPI Wendy Frye is a 57 y.o. female comes to the urgent care with a 1 week history of cough productive of clear sputum, nasal congestion and clear rhinorrhea.  Patient denies any history of fever or chills.  No sick contacts.  No shortness of breath or wheezing.  No generalized body aches or headaches.  No sinus pressure.  Patient has had a history of elevated blood pressure in the past.  She was not started on antihypertensive medications at that time.  They recommended weight loss, decrease salt intake and increase physical activity.  Patient's blood pressure has creeped up because she has recently gained some weight.  She was taking hydrochlorothiazide in 2022.  No chest pain or chest pressure.  No numbness or tingling.   HPI  Past Medical History:  Diagnosis Date   Hypertension    Hypertension 05/31/2017    Patient Active Problem List   Diagnosis Date Noted   Primary osteoarthritis of right hip 10/25/2021   Bilateral carpal tunnel syndrome 11/18/2018   Arthritis of knee, left 11/12/2017   Hypertension 05/31/2017   Muscle twitch 05/31/2017   History of vertigo 05/31/2017   Leiomyoma of uterus, unspecified 03/09/2014   Abnormal pelvic ultrasound 03/09/2014    Past Surgical History:  Procedure Laterality Date   BREAST ENHANCEMENT SURGERY     CARPAL TUNNEL RELEASE     CESAREAN SECTION      OB History   No obstetric history on file.      Home Medications    Prior to Admission medications   Medication Sig Start Date End Date Taking? Authorizing Provider  benzonatate (TESSALON) 200 MG capsule Take 1 capsule (200 mg total) by mouth 3 (three) times daily as needed for cough. 04/28/23  Yes Hanad Leino, Britta Mccreedy, MD   fluticasone (FLONASE) 50 MCG/ACT nasal spray Place 1 spray into both nostrils daily. 04/28/23  Yes Anairis Knick, Britta Mccreedy, MD  guaiFENesin (MUCINEX) 600 MG 12 hr tablet Take 1 tablet (600 mg total) by mouth 2 (two) times daily as needed. 04/28/23  Yes Mayleen Borrero, Britta Mccreedy, MD  amLODipine (NORVASC) 5 MG tablet Take 1 tablet (5 mg total) by mouth daily. 04/28/23   Jvion Turgeon, Britta Mccreedy, MD  meloxicam (MOBIC) 15 MG tablet One tab PO qAM with a meal for 2 weeks, then daily prn pain. 08/18/22   Monica Becton, MD  norethindrone (MICRONOR) 0.35 MG tablet Take 1 tablet by mouth daily.    [provider]  hydrochlorothiazide (HYDRODIURIL) 25 MG tablet Take 1 tablet (25 mg total) by mouth daily. Patient not taking: Reported on 05/11/2021 06/14/17 05/11/21  Sunnie Nielsen, DO    Family History Family History  Problem Relation Age of Onset   Hypertension Mother    Diabetes Mother    Hyperlipidemia Mother    Alcohol abuse Father    Diabetes Maternal Grandmother    Hypertension Maternal Grandmother     Social History Social History   Tobacco Use   Smoking status: Never   Smokeless tobacco: Never  Vaping Use   Vaping Use: Never used  Substance Use Topics   Alcohol use: No   Drug use: No  Allergies   Darvon [propoxyphene], Asa [aspirin], Ciprofloxacin, Motrin [ibuprofen], and Penicillins   Review of Systems Review of Systems As per HPI  Physical Exam Triage Vital Signs ED Triage Vitals  Enc Vitals Group     BP 04/28/23 0857 (!) 164/106     Pulse Rate 04/28/23 0857 78     Resp 04/28/23 0857 16     Temp 04/28/23 0857 99.6 F (37.6 C)     Temp Source 04/28/23 0857 Oral     SpO2 04/28/23 0857 98 %     Weight 04/28/23 0902 190 lb (86.2 kg)     Height 04/28/23 0902 5\' 3"  (1.6 m)     Head Circumference --      Peak Flow --      Pain Score 04/28/23 0901 3     Pain Loc --      Pain Edu? --      Excl. in GC? --    No data found.  Updated Vital Signs BP (!) 164/106 (BP  Location: Left Arm) Comment: not on meds- hx of HTN-  Pulse 78   Temp 99.6 F (37.6 C) (Oral)   Resp 16   Ht 5\' 3"  (1.6 m)   Wt 86.2 kg   LMP  (LMP Unknown)   SpO2 98%   BMI 33.66 kg/m   Visual Acuity Right Eye Distance:   Left Eye Distance:   Bilateral Distance:    Right Eye Near:   Left Eye Near:    Bilateral Near:     Physical Exam Vitals and nursing note reviewed.  Constitutional:      General: She is not in acute distress.    Appearance: She is not ill-appearing.  HENT:     Right Ear: Tympanic membrane normal.     Left Ear: Tympanic membrane normal.     Mouth/Throat:     Mouth: Mucous membranes are moist.     Pharynx: No oropharyngeal exudate or posterior oropharyngeal erythema.  Cardiovascular:     Rate and Rhythm: Normal rate and regular rhythm.     Pulses: Normal pulses.     Heart sounds: Normal heart sounds.  Pulmonary:     Effort: Pulmonary effort is normal.     Breath sounds: Normal breath sounds.  Abdominal:     General: Bowel sounds are normal.     Palpations: Abdomen is soft.  Musculoskeletal:        General: Normal range of motion.  Neurological:     Mental Status: She is alert.      UC Treatments / Results  Labs (all labs ordered are listed, but only abnormal results are displayed) Labs Reviewed - No data to display  EKG   Radiology No results found.  Procedures Procedures (including critical care time)  Medications Ordered in UC Medications - No data to display  Initial Impression / Assessment and Plan / UC Course  I have reviewed the triage vital signs and the nursing notes.  Pertinent labs & imaging results that were available during my care of the patient were reviewed by me and considered in my medical decision making (see chart for details).     1.  Allergic cough: Tessalon Perles as needed for cough Fluticasone nasal spray Adequate hydration recommended Tylenol or Motrin as needed for pain and/or fever.  2.   Elevated blood pressure without a diagnosis of hypertension: Patient is advised to keep a log of her blood pressure I will start patient amlodipine 5 mg  orally daily Patient is advised to return to urgent care if pressure remains elevated Patient advised to follow-up with primary care physician.  Final Clinical Impressions(s) / UC Diagnoses   Final diagnoses:  Allergic cough  Elevated BP without diagnosis of hypertension     Discharge Instructions      Please maintain adequate hydration Please take medications as prescribed Continue to exercise as tolerated Please maintain a log of your blood pressures at home No indication for antibiotics at this time If you have worsening symptoms please return to urgent care to be reevaluated.   ED Prescriptions     Medication Sig Dispense Auth. Provider   benzonatate (TESSALON) 200 MG capsule Take 1 capsule (200 mg total) by mouth 3 (three) times daily as needed for cough. 21 capsule Abbigayle Toole, Britta Mccreedy, MD   fluticasone (FLONASE) 50 MCG/ACT nasal spray Place 1 spray into both nostrils daily. 16 g Merrilee Jansky, MD   amLODipine (NORVASC) 10 MG tablet  (Status: Discontinued) Take 1 tablet (10 mg total) by mouth daily. 30 tablet Nayelis Bonito, Britta Mccreedy, MD   guaiFENesin (MUCINEX) 600 MG 12 hr tablet Take 1 tablet (600 mg total) by mouth 2 (two) times daily as needed. 20 tablet Ondrea Dow, Britta Mccreedy, MD   amLODipine (NORVASC) 5 MG tablet Take 1 tablet (5 mg total) by mouth daily. 30 tablet Clearence Vitug, Britta Mccreedy, MD      PDMP not reviewed this encounter.   Merrilee Jansky, MD 04/28/23 1038

## 2023-06-25 ENCOUNTER — Ambulatory Visit: Payer: Self-pay | Admitting: Sports Medicine

## 2023-06-25 DIAGNOSIS — M1611 Unilateral primary osteoarthritis, right hip: Secondary | ICD-10-CM

## 2023-06-25 DIAGNOSIS — M25551 Pain in right hip: Secondary | ICD-10-CM

## 2023-06-25 MED ORDER — MELOXICAM 15 MG PO TABS
ORAL_TABLET | ORAL | 3 refills | Status: AC
Start: 2023-06-25 — End: ?

## 2023-06-25 NOTE — Progress Notes (Signed)
    Procedures performed today:    None.  Independent interpretation of notes and tests performed by another provider:   None.  Brief History, Exam, Impression, and Recommendations:    Primary osteoarthritis of right hip This pleasant 57 year old female returns, she has known hip osteoarthritis, we saw her back in 2022 and she partially improved with meloxicam, we injected her hip under guidance, she did okay, she is starting a new job and just needs some documentation for work restrictions, I wrote her a note today, we will refill her meloxicam today, return to see me as needed.    ____________________________________________ Ihor Austin. Benjamin Stain, M.D., ABFM., CAQSM., AME. Primary Care and Sports Medicine [facility]  Adjunct Professor of Family Medicine  [employer]  Restaurant manager, fast food

## 2023-06-25 NOTE — Assessment & Plan Note (Signed)
This pleasant 56 year old female returns, she has known hip osteoarthritis, we saw her back in 2022 and she partially improved with meloxicam, we injected her hip under guidance, she did okay, she is starting a new job and just needs some documentation for work restrictions, I wrote her a note today, we will refill her meloxicam today, return to see me as needed.

## 2024-01-01 ENCOUNTER — Telehealth: Payer: Self-pay | Admitting: Sports Medicine

## 2024-01-01 NOTE — Telephone Encounter (Signed)
 Copied from CRM [id number]. Topic: Clinical - Medical Advice >> Jan 01, 2024  9:51 AM Shelah Lewandowsky wrote: Reason for CRM: Patient asking about PRP Therapy, if Dr T offers it, would like to get that.  Please call patient [phone]

## 2024-01-01 NOTE — Telephone Encounter (Signed)
 I would be happy to have a discussion with her about what we are using it for, the pros, the cons and alternatives.  So we should have a consultation visit first, 15 minutes.

## 2024-01-03 NOTE — Telephone Encounter (Signed)
Patient informed and scheduled for visit to discuss PRP therapy for 01/07/2023 with Dr. Benjamin Stain.

## 2024-01-08 ENCOUNTER — Ambulatory Visit: Payer: Self-pay | Admitting: Sports Medicine

## 2024-01-09 ENCOUNTER — Other Ambulatory Visit: Payer: Self-pay | Admitting: Obstetrics and Gynecology

## 2024-01-09 DIAGNOSIS — R928 Other abnormal and inconclusive findings on diagnostic imaging of breast: Secondary | ICD-10-CM

## 2024-01-10 ENCOUNTER — Telehealth: Payer: Self-pay

## 2024-01-10 NOTE — Telephone Encounter (Signed)
Telephoned patient at mobile number. Patient qualified for BCCCP services. Patient declined BCCCP services to keep appointment scheduled at the [facility].

## 2024-01-14 ENCOUNTER — Ambulatory Visit
Admission: RE | Admit: 2024-01-14 | Discharge: 2024-01-14 | Disposition: A | Discharge status: Home / Self Care | Payer: Self-pay | Source: Ambulatory Visit | Attending: Obstetrics and Gynecology | Admitting: Obstetrics and Gynecology

## 2024-01-14 ENCOUNTER — Other Ambulatory Visit: Payer: Self-pay | Admitting: Obstetrics and Gynecology

## 2024-01-14 DIAGNOSIS — R928 Other abnormal and inconclusive findings on diagnostic imaging of breast: Secondary | ICD-10-CM

## 2024-01-14 DIAGNOSIS — R921 Mammographic calcification found on diagnostic imaging of breast: Secondary | ICD-10-CM

## 2024-01-14 NOTE — Telephone Encounter (Signed)
Telephoned patient at mobile number. Patient completed screening process with updated income. Patient ineligible for BCCCP program.

## 2024-01-25 ENCOUNTER — Ambulatory Visit
Admission: RE | Admit: 2024-01-25 | Discharge: 2024-01-25 | Disposition: A | Discharge status: Home / Self Care | Payer: No Typology Code available for payment source | Source: Ambulatory Visit | Attending: Obstetrics and Gynecology | Admitting: Obstetrics and Gynecology

## 2024-01-25 ENCOUNTER — Ambulatory Visit
Admission: RE | Admit: 2024-01-25 | Discharge: 2024-01-25 | Disposition: A | Discharge status: Home / Self Care | Payer: Self-pay | Source: Ambulatory Visit | Attending: Obstetrics and Gynecology | Admitting: Obstetrics and Gynecology

## 2024-01-25 DIAGNOSIS — R921 Mammographic calcification found on diagnostic imaging of breast: Secondary | ICD-10-CM

## 2024-01-25 HISTORY — PX: BREAST BIOPSY: SHX20

## 2024-01-25 NOTE — Procedures (Signed)
 Procedure Note  Procedure: Stereotactic biopsy of right breast calcification  Indication: Right breast lobular asymmetry with calcification.  Findings: Please refer to procedural dictation for full description.  Complications: None  EBL: < 10 mL  Aliene Lloyd, MD [phone]

## 2024-01-28 LAB — SURGICAL PATHOLOGY

## 2024-08-19 ENCOUNTER — Encounter: Payer: Self-pay | Admitting: Sports Medicine
